# Patient Record
Sex: Female | Born: 1935 | Race: White | Hispanic: No | Marital: Single | Smoking: Former smoker
Health system: Southern US, Community
[De-identification: ages and names within clinical notes are randomized; demographics above are authoritative.]

## PROBLEM LIST (undated history)

## (undated) ENCOUNTER — Emergency Department (HOSPITAL_COMMUNITY): Payer: Medicare HMO | Source: Home / Self Care

## (undated) DIAGNOSIS — I10 Essential (primary) hypertension: Secondary | ICD-10-CM

## (undated) DIAGNOSIS — C719 Malignant neoplasm of brain, unspecified: Secondary | ICD-10-CM

## (undated) DIAGNOSIS — C4490 Unspecified malignant neoplasm of skin, unspecified: Secondary | ICD-10-CM

## (undated) DIAGNOSIS — I4891 Unspecified atrial fibrillation: Secondary | ICD-10-CM

## (undated) DIAGNOSIS — C55 Malignant neoplasm of uterus, part unspecified: Secondary | ICD-10-CM

## (undated) DIAGNOSIS — I219 Acute myocardial infarction, unspecified: Secondary | ICD-10-CM

## (undated) DIAGNOSIS — S065X9A Traumatic subdural hemorrhage with loss of consciousness of unspecified duration, initial encounter: Secondary | ICD-10-CM

## (undated) DIAGNOSIS — C3491 Malignant neoplasm of unspecified part of right bronchus or lung: Principal | ICD-10-CM

## (undated) DIAGNOSIS — I2510 Atherosclerotic heart disease of native coronary artery without angina pectoris: Secondary | ICD-10-CM

## (undated) HISTORY — DX: Malignant neoplasm of unspecified part of right bronchus or lung: C34.91

## (undated) HISTORY — PX: ABDOMINAL HYSTERECTOMY: SHX81

## (undated) HISTORY — PX: REPLACEMENT TOTAL KNEE: SUR1224

## (undated) HISTORY — PX: TONSILLECTOMY: SUR1361

## (undated) HISTORY — PX: CORONARY ANGIOPLASTY WITH STENT PLACEMENT: SHX49

## (undated) HISTORY — PX: APPENDECTOMY: SHX54

---

## 2009-01-01 ENCOUNTER — Ambulatory Visit: Payer: Self-pay | Admitting: Diagnostic Radiology

## 2009-01-01 ENCOUNTER — Emergency Department (HOSPITAL_BASED_OUTPATIENT_CLINIC_OR_DEPARTMENT_OTHER): Admission: EM | Admit: 2009-01-01 | Discharge: 2009-01-01 | Payer: Self-pay | Admitting: Emergency Medicine

## 2009-02-24 ENCOUNTER — Ambulatory Visit: Payer: Self-pay | Admitting: Diagnostic Radiology

## 2009-02-24 ENCOUNTER — Emergency Department (HOSPITAL_BASED_OUTPATIENT_CLINIC_OR_DEPARTMENT_OTHER): Admission: EM | Admit: 2009-02-24 | Discharge: 2009-02-24 | Payer: Self-pay | Admitting: Emergency Medicine

## 2011-01-05 LAB — DIFFERENTIAL
Basophils Absolute: 0.1 10*3/uL (ref 0.0–0.1)
Basophils Relative: 2 % — ABNORMAL HIGH (ref 0–1)
Eosinophils Relative: 2 % (ref 0–5)
Monocytes Absolute: 0.5 10*3/uL (ref 0.1–1.0)

## 2011-01-05 LAB — URINALYSIS, ROUTINE W REFLEX MICROSCOPIC
Bilirubin Urine: NEGATIVE
Glucose, UA: NEGATIVE mg/dL
Ketones, ur: NEGATIVE mg/dL
Specific Gravity, Urine: 1.008 (ref 1.005–1.030)
pH: 6.5 (ref 5.0–8.0)

## 2011-01-05 LAB — BASIC METABOLIC PANEL
CO2: 33 mEq/L — ABNORMAL HIGH (ref 19–32)
Chloride: 100 mEq/L (ref 96–112)
Glucose, Bld: 110 mg/dL — ABNORMAL HIGH (ref 70–99)
Potassium: 4 mEq/L (ref 3.5–5.1)
Sodium: 140 mEq/L (ref 135–145)

## 2011-01-05 LAB — URINE MICROSCOPIC-ADD ON

## 2011-01-05 LAB — CBC
HCT: 45.6 % (ref 36.0–46.0)
Hemoglobin: 14.8 g/dL (ref 12.0–15.0)
MCHC: 32.5 g/dL (ref 30.0–36.0)
RDW: 14 % (ref 11.5–15.5)

## 2011-01-06 LAB — POCT CARDIAC MARKERS
CKMB, poc: 2 ng/mL (ref 1.0–8.0)
Myoglobin, poc: >500 ng/mL (ref 12–200)
Troponin i, poc: <0.05 ng/mL (ref 0.00–0.09)

## 2011-01-06 LAB — COMPREHENSIVE METABOLIC PANEL
ALT: 6 U/L (ref 0–35)
AST: 37 U/L (ref 0–37)
Albumin: 4.2 g/dL (ref 3.5–5.2)
CO2: 32 mEq/L (ref 19–32)
Calcium: 9.2 mg/dL (ref 8.4–10.5)
Creatinine, Ser: 0.8 mg/dL (ref 0.4–1.2)
GFR calc Af Amer: >60 mL/min (ref >60)
Sodium: 136 mEq/L (ref 135–145)
Total Protein: 7.4 g/dL (ref 6.0–8.3)

## 2011-01-06 LAB — CBC
MCHC: 33.7 g/dL (ref 30.0–36.0)
Platelets: 187 10*3/uL (ref 150–400)
RBC: 5.76 MIL/uL — ABNORMAL HIGH (ref 3.87–5.11)
RDW: 14.3 % (ref 11.5–15.5)

## 2011-01-06 LAB — DIFFERENTIAL
Eosinophils Absolute: 0.1 10*3/uL (ref 0.0–0.7)
Eosinophils Relative: 1 % (ref 0–5)
Lymphocytes Relative: 12 % (ref 12–46)
Lymphs Abs: 1.4 10*3/uL (ref 0.7–4.0)
Monocytes Absolute: 0.8 10*3/uL (ref 0.1–1.0)
Monocytes Relative: 7 % (ref 3–12)

## 2015-12-17 ENCOUNTER — Encounter (HOSPITAL_BASED_OUTPATIENT_CLINIC_OR_DEPARTMENT_OTHER): Payer: Self-pay | Admitting: Emergency Medicine

## 2015-12-17 ENCOUNTER — Emergency Department (HOSPITAL_BASED_OUTPATIENT_CLINIC_OR_DEPARTMENT_OTHER)
Admission: EM | Admit: 2015-12-17 | Discharge: 2015-12-17 | Disposition: A | Discharge status: Home / Self Care | Payer: Medicare HMO | Attending: Emergency Medicine | Admitting: Emergency Medicine

## 2015-12-17 DIAGNOSIS — Z8542 Personal history of malignant neoplasm of other parts of uterus: Secondary | ICD-10-CM | POA: Insufficient documentation

## 2015-12-17 DIAGNOSIS — Z880 Allergy status to penicillin: Secondary | ICD-10-CM | POA: Insufficient documentation

## 2015-12-17 DIAGNOSIS — I10 Essential (primary) hypertension: Secondary | ICD-10-CM | POA: Insufficient documentation

## 2015-12-17 DIAGNOSIS — K644 Residual hemorrhoidal skin tags: Secondary | ICD-10-CM | POA: Insufficient documentation

## 2015-12-17 DIAGNOSIS — R61 Generalized hyperhidrosis: Secondary | ICD-10-CM | POA: Insufficient documentation

## 2015-12-17 DIAGNOSIS — I252 Old myocardial infarction: Secondary | ICD-10-CM | POA: Insufficient documentation

## 2015-12-17 DIAGNOSIS — R110 Nausea: Secondary | ICD-10-CM | POA: Diagnosis not present

## 2015-12-17 DIAGNOSIS — Z7901 Long term (current) use of anticoagulants: Secondary | ICD-10-CM | POA: Insufficient documentation

## 2015-12-17 DIAGNOSIS — I4891 Unspecified atrial fibrillation: Secondary | ICD-10-CM | POA: Insufficient documentation

## 2015-12-17 DIAGNOSIS — I2510 Atherosclerotic heart disease of native coronary artery without angina pectoris: Secondary | ICD-10-CM | POA: Diagnosis not present

## 2015-12-17 DIAGNOSIS — R42 Dizziness and giddiness: Secondary | ICD-10-CM | POA: Diagnosis not present

## 2015-12-17 DIAGNOSIS — Z85828 Personal history of other malignant neoplasm of skin: Secondary | ICD-10-CM | POA: Insufficient documentation

## 2015-12-17 DIAGNOSIS — K625 Hemorrhage of anus and rectum: Secondary | ICD-10-CM

## 2015-12-17 HISTORY — DX: Malignant neoplasm of uterus, part unspecified: C55

## 2015-12-17 HISTORY — DX: Acute myocardial infarction, unspecified: I21.9

## 2015-12-17 HISTORY — DX: Essential (primary) hypertension: I10

## 2015-12-17 HISTORY — DX: Unspecified malignant neoplasm of skin, unspecified: C44.90

## 2015-12-17 HISTORY — DX: Unspecified atrial fibrillation: I48.91

## 2015-12-17 HISTORY — DX: Atherosclerotic heart disease of native coronary artery without angina pectoris: I25.10

## 2015-12-17 LAB — URINALYSIS, ROUTINE W REFLEX MICROSCOPIC
BILIRUBIN URINE: NEGATIVE
Glucose, UA: NEGATIVE mg/dL
Ketones, ur: NEGATIVE mg/dL
Leukocytes, UA: NEGATIVE
NITRITE: NEGATIVE
PROTEIN: NEGATIVE mg/dL
SPECIFIC GRAVITY, URINE: 1.011 (ref 1.005–1.030)
pH: 6 (ref 5.0–8.0)

## 2015-12-17 LAB — PROTIME-INR
INR: 2.58 — AB (ref 0.00–1.49)
PROTHROMBIN TIME: 27.3 s — AB (ref 11.6–15.2)

## 2015-12-17 LAB — COMPREHENSIVE METABOLIC PANEL
ALT: 8 U/L — ABNORMAL LOW (ref 14–54)
AST: 21 U/L (ref 15–41)
Albumin: 3.6 g/dL (ref 3.5–5.0)
Alkaline Phosphatase: 96 U/L (ref 38–126)
Anion gap: 8 (ref 5–15)
BILIRUBIN TOTAL: 0.5 mg/dL (ref 0.3–1.2)
BUN: 23 mg/dL — ABNORMAL HIGH (ref 6–20)
CHLORIDE: 101 mmol/L (ref 101–111)
CO2: 30 mmol/L (ref 22–32)
CREATININE: 0.83 mg/dL (ref 0.44–1.00)
Calcium: 9.2 mg/dL (ref 8.9–10.3)
Glucose, Bld: 113 mg/dL — ABNORMAL HIGH (ref 65–99)
POTASSIUM: 3.5 mmol/L (ref 3.5–5.1)
Sodium: 139 mmol/L (ref 135–145)
TOTAL PROTEIN: 6.2 g/dL — AB (ref 6.5–8.1)

## 2015-12-17 LAB — CBC WITH DIFFERENTIAL/PLATELET
BASOS PCT: 0 %
Basophils Absolute: 0 10*3/uL (ref 0.0–0.1)
EOS ABS: 0.1 10*3/uL (ref 0.0–0.7)
EOS PCT: 1 %
HCT: 44.1 % (ref 36.0–46.0)
Hemoglobin: 14.3 g/dL (ref 12.0–15.0)
Lymphocytes Relative: 17 %
Lymphs Abs: 1 10*3/uL (ref 0.7–4.0)
MCH: 27.6 pg (ref 26.0–34.0)
MCHC: 32.4 g/dL (ref 30.0–36.0)
MCV: 85.1 fL (ref 78.0–100.0)
MONO ABS: 0.6 10*3/uL (ref 0.1–1.0)
MONOS PCT: 10 %
NEUTROS PCT: 72 %
Neutro Abs: 4.2 10*3/uL (ref 1.7–7.7)
Platelets: 209 10*3/uL (ref 150–400)
RBC: 5.18 MIL/uL — ABNORMAL HIGH (ref 3.87–5.11)
RDW: 15.2 % (ref 11.5–15.5)
WBC: 5.9 10*3/uL (ref 4.0–10.5)

## 2015-12-17 LAB — URINE MICROSCOPIC-ADD ON

## 2015-12-17 LAB — OCCULT BLOOD X 1 CARD TO LAB, STOOL: FECAL OCCULT BLD: POSITIVE — AB

## 2015-12-17 NOTE — ED Notes (Signed)
Bright red Rectal bleeding since Monday.  Some dizziness on Sunday with nausea which subsided.  Pt states she is on xarelto.

## 2015-12-17 NOTE — Discharge Instructions (Signed)

## 2015-12-17 NOTE — ED Notes (Signed)
MD at bedside. 

## 2015-12-17 NOTE — ED Provider Notes (Signed)
CSN: [id number]     Arrival date & time 12/17/15  0944 History   First MD Initiated Contact with Patient 12/17/15 1013     Chief Complaint  Patient presents with  . Rectal Bleeding     (Consider location/radiation/quality/duration/timing/severity/associated sxs/prior Treatment) Patient is a 82 y.o. female presenting with hematochezia.  Rectal Bleeding  [name] Is a spry 82 year old female who presents the emergency department with chief complaint of rectal bleeding. She has a past medical history of atrial fibrillation for which she takes Xarelto, coronary artery disease, hypertension, skin cancer, uterine cancer, history of MI. The patient states that Sunday, 3 days ago she had an episode while she was out shopping where she became suddenly dizzy, nauseous, diaphoretic with diffuse abdominal pain. She states she is unable to localize the pain. She denies chest pain or shortness of breath. She denies vomiting. Patient states that lasted about 10 minutes, then resolved and had no more problems. Monday night. The patient made a bowel movement and had significant bright red blood within the stool and in the toilet bowl. She continues to have some bleeding after the bowel movement which soaked into a padded shoe for but denies any clotting or heavy bleeding. The patient states she has made 2 more bowel movements with blood. However, the blood seemed to be less than the previous. She denies any known history of internal hemorrhoids. She states she has had previous episodes of rectal bleeding. However, she states it was a single episode that seemed to resolve without significant bleeding such as what she experienced over the past couple days. She denies any lightheadedness, shortness of breath, dyspnea on exertion, presyncope. She denies abdominal pain or tenderness. Past Medical History  Diagnosis Date  . Coronary artery disease   . MI (myocardial infarction) ([place])   . A-fib ([place])   .  Hypertension   . Uterine cancer ([place])   . Skin cancer    Past Surgical History  Procedure Laterality Date  . Abdominal hysterectomy    . Appendectomy    . Tonsillectomy    . Coronary angioplasty with stent placement    . Replacement total knee     No family history on file. Social History  Substance Use Topics  . Smoking status: Never Smoker   . Smokeless tobacco: None  . Alcohol Use: None   OB History    No data available     Review of Systems  Gastrointestinal: Positive for hematochezia.  Ten systems reviewed and are negative for acute change, except as noted in the HPI.    Ten systems reviewed and are negative for acute change, except as noted in the HPI.    Allergies  Iodine; Penicillins; and Sulfa antibiotics  Home Medications   Prior to Admission medications   Medication Sig Start Date End Date Taking? Authorizing Provider  Rivaroxaban (XARELTO) 15 MG TABS tablet Take 15 mg by mouth 2 (two) times daily with a meal.   Yes Historical Provider, MD   BP 140/58 mmHg  Pulse 61  Temp(Src) 98.5 F (36.9 C) (Oral)  Resp 18  Ht '5\' 6"'$  (1.676 m)  Wt 90.266 kg  BMI 32.13 kg/m2  SpO2 97% Physical Exam  Constitutional: She is oriented to person, place, and time. She appears well-developed and well-nourished. No distress.  HENT:  Head: Normocephalic and atraumatic.  Eyes: Conjunctivae are normal. No scleral icterus.  Neck: Normal range of motion.  Cardiovascular: Normal rate, regular rhythm and normal heart sounds.  Exam reveals no gallop and no friction rub.   No murmur heard. Pulmonary/Chest: Effort normal and breath sounds normal. No respiratory distress.  Abdominal: Soft. Bowel sounds are normal. She exhibits no distension and no mass. There is no tenderness. There is no guarding.  Genitourinary:  Digital Rectal Exam reveals sphincter with good tone. Non-thrombosed external hemorrhoids. No masses or fissures. Stool color is brown and red.  Neurological: She is  alert and oriented to person, place, and time.  Skin: Skin is warm and dry. She is not diaphoretic.  Nursing note and vitals reviewed.   ED Course  Procedures (including critical care time) Labs Review Labs Reviewed  CBC WITH DIFFERENTIAL/PLATELET  PROTIME-INR  COMPREHENSIVE METABOLIC PANEL  URINALYSIS, ROUTINE W REFLEX MICROSCOPIC (NOT AT [facility])  OCCULT BLOOD X 1 CARD TO LAB, STOOL    Imaging Review No results found. I have personally reviewed and evaluated these images and lab results as part of my medical decision-making.   EKG Interpretation None      MDM   Final diagnoses:  None    10:34 AM BP 140/58 mmHg  Pulse 61  Temp(Src) 98.5 F (36.9 C) (Oral)  Resp 18  Ht '5\' 6"'$  (1.676 m)  Wt 90.266 kg  BMI 32.13 kg/m2  SpO2 97% Patient with BRPR. +FOBT. Awaiting labs   12:15 PM BP 173/89 mmHg  Pulse 74  Temp(Src) 98.5 F (36.9 C) (Oral)  Resp 18  Ht '5\' 6"'$  (1.676 m)  Wt 90.266 kg  BMI 32.13 kg/m2  SpO2 97%  The patient's CMP normal. UA is negative. Her hemoglobin is 14. PT/INR are elevated. She is asymptomatic at this time. Patient seen in shared visit with attending physician. Discussed exam findings with the patient. Patient is aware she will need a follow-up plan with her PCP and a colonoscopy with a gastroenterologist. I have given her a referral. She appears safe for discharge at this time. Discussed return precautions.  Margarita Mail, PA-C 12/17/15 [place], MD 12/17/15 [phone]

## 2016-09-13 ENCOUNTER — Emergency Department (HOSPITAL_COMMUNITY): Payer: Medicare HMO

## 2016-09-13 ENCOUNTER — Inpatient Hospital Stay (HOSPITAL_COMMUNITY)
Admission: EM | Admit: 2016-09-13 | Discharge: 2016-09-18 | DRG: 082 | Disposition: A | Discharge status: Skilled Nursing Facility | Payer: Medicare HMO | Attending: Internal Medicine | Admitting: Internal Medicine

## 2016-09-13 ENCOUNTER — Encounter (HOSPITAL_COMMUNITY): Payer: Self-pay

## 2016-09-13 DIAGNOSIS — I4892 Unspecified atrial flutter: Secondary | ICD-10-CM | POA: Diagnosis present

## 2016-09-13 DIAGNOSIS — R900 Intracranial space-occupying lesion found on diagnostic imaging of central nervous system: Secondary | ICD-10-CM | POA: Diagnosis present

## 2016-09-13 DIAGNOSIS — W19XXXA Unspecified fall, initial encounter: Secondary | ICD-10-CM

## 2016-09-13 DIAGNOSIS — R918 Other nonspecific abnormal finding of lung field: Secondary | ICD-10-CM

## 2016-09-13 DIAGNOSIS — S12100A Unspecified displaced fracture of second cervical vertebra, initial encounter for closed fracture: Secondary | ICD-10-CM

## 2016-09-13 DIAGNOSIS — G8929 Other chronic pain: Secondary | ICD-10-CM | POA: Diagnosis present

## 2016-09-13 DIAGNOSIS — Z79899 Other long term (current) drug therapy: Secondary | ICD-10-CM

## 2016-09-13 DIAGNOSIS — D496 Neoplasm of unspecified behavior of brain: Secondary | ICD-10-CM

## 2016-09-13 DIAGNOSIS — Z66 Do not resuscitate: Secondary | ICD-10-CM | POA: Diagnosis present

## 2016-09-13 DIAGNOSIS — Z7901 Long term (current) use of anticoagulants: Secondary | ICD-10-CM

## 2016-09-13 DIAGNOSIS — G9389 Other specified disorders of brain: Secondary | ICD-10-CM

## 2016-09-13 DIAGNOSIS — E785 Hyperlipidemia, unspecified: Secondary | ICD-10-CM | POA: Diagnosis present

## 2016-09-13 DIAGNOSIS — Z85828 Personal history of other malignant neoplasm of skin: Secondary | ICD-10-CM

## 2016-09-13 DIAGNOSIS — E860 Dehydration: Secondary | ICD-10-CM | POA: Diagnosis present

## 2016-09-13 DIAGNOSIS — C7931 Secondary malignant neoplasm of brain: Secondary | ICD-10-CM | POA: Diagnosis present

## 2016-09-13 DIAGNOSIS — S065X9A Traumatic subdural hemorrhage with loss of consciousness of unspecified duration, initial encounter: Secondary | ICD-10-CM | POA: Diagnosis not present

## 2016-09-13 DIAGNOSIS — S065XAA Traumatic subdural hemorrhage with loss of consciousness status unknown, initial encounter: Secondary | ICD-10-CM

## 2016-09-13 DIAGNOSIS — I6200 Nontraumatic subdural hemorrhage, unspecified: Secondary | ICD-10-CM | POA: Diagnosis not present

## 2016-09-13 DIAGNOSIS — S0181XA Laceration without foreign body of other part of head, initial encounter: Secondary | ICD-10-CM

## 2016-09-13 DIAGNOSIS — Z8542 Personal history of malignant neoplasm of other parts of uterus: Secondary | ICD-10-CM

## 2016-09-13 DIAGNOSIS — Z955 Presence of coronary angioplasty implant and graft: Secondary | ICD-10-CM

## 2016-09-13 DIAGNOSIS — C801 Malignant (primary) neoplasm, unspecified: Secondary | ICD-10-CM

## 2016-09-13 DIAGNOSIS — J95811 Postprocedural pneumothorax: Secondary | ICD-10-CM

## 2016-09-13 DIAGNOSIS — E871 Hypo-osmolality and hyponatremia: Secondary | ICD-10-CM | POA: Diagnosis present

## 2016-09-13 DIAGNOSIS — Z23 Encounter for immunization: Secondary | ICD-10-CM

## 2016-09-13 DIAGNOSIS — Z923 Personal history of irradiation: Secondary | ICD-10-CM

## 2016-09-13 DIAGNOSIS — S12110A Anterior displaced Type II dens fracture, initial encounter for closed fracture: Secondary | ICD-10-CM | POA: Diagnosis present

## 2016-09-13 DIAGNOSIS — C3431 Malignant neoplasm of lower lobe, right bronchus or lung: Secondary | ICD-10-CM | POA: Diagnosis present

## 2016-09-13 DIAGNOSIS — I4891 Unspecified atrial fibrillation: Secondary | ICD-10-CM | POA: Diagnosis present

## 2016-09-13 DIAGNOSIS — I219 Acute myocardial infarction, unspecified: Secondary | ICD-10-CM | POA: Diagnosis present

## 2016-09-13 DIAGNOSIS — S12001A Unspecified nondisplaced fracture of first cervical vertebra, initial encounter for closed fracture: Secondary | ICD-10-CM

## 2016-09-13 DIAGNOSIS — I10 Essential (primary) hypertension: Secondary | ICD-10-CM | POA: Diagnosis present

## 2016-09-13 DIAGNOSIS — C55 Malignant neoplasm of uterus, part unspecified: Secondary | ICD-10-CM | POA: Diagnosis present

## 2016-09-13 DIAGNOSIS — I252 Old myocardial infarction: Secondary | ICD-10-CM

## 2016-09-13 DIAGNOSIS — K219 Gastro-esophageal reflux disease without esophagitis: Secondary | ICD-10-CM | POA: Diagnosis present

## 2016-09-13 DIAGNOSIS — I2510 Atherosclerotic heart disease of native coronary artery without angina pectoris: Secondary | ICD-10-CM | POA: Diagnosis present

## 2016-09-13 DIAGNOSIS — S02113A Unspecified occipital condyle fracture, initial encounter for closed fracture: Secondary | ICD-10-CM | POA: Diagnosis present

## 2016-09-13 DIAGNOSIS — Z7982 Long term (current) use of aspirin: Secondary | ICD-10-CM

## 2016-09-13 DIAGNOSIS — G936 Cerebral edema: Secondary | ICD-10-CM | POA: Diagnosis present

## 2016-09-13 DIAGNOSIS — M6281 Muscle weakness (generalized): Secondary | ICD-10-CM

## 2016-09-13 HISTORY — DX: Traumatic subdural hemorrhage with loss of consciousness of unspecified duration, initial encounter: S06.5X9A

## 2016-09-13 HISTORY — DX: Traumatic subdural hemorrhage with loss of consciousness status unknown, initial encounter: S06.5XAA

## 2016-09-13 LAB — CBC WITH DIFFERENTIAL/PLATELET
BASOS ABS: 0 10*3/uL (ref 0.0–0.1)
BASOS PCT: 0 %
EOS ABS: 0 10*3/uL (ref 0.0–0.7)
Eosinophils Relative: 0 %
HEMATOCRIT: 42.5 % (ref 36.0–46.0)
Hemoglobin: 13.9 g/dL (ref 12.0–15.0)
Lymphocytes Relative: 8 %
Lymphs Abs: 1.1 10*3/uL (ref 0.7–4.0)
MCH: 27.1 pg (ref 26.0–34.0)
MCHC: 32.7 g/dL (ref 30.0–36.0)
MCV: 83 fL (ref 78.0–100.0)
MONO ABS: 0.8 10*3/uL (ref 0.1–1.0)
MONOS PCT: 6 %
NEUTROS ABS: 11.9 10*3/uL — AB (ref 1.7–7.7)
NEUTROS PCT: 86 %
PLATELETS: 218 10*3/uL (ref 150–400)
RBC: 5.12 MIL/uL — ABNORMAL HIGH (ref 3.87–5.11)
RDW: 14.9 % (ref 11.5–15.5)
WBC: 13.8 10*3/uL — ABNORMAL HIGH (ref 4.0–10.5)

## 2016-09-13 LAB — URINALYSIS, ROUTINE W REFLEX MICROSCOPIC
BILIRUBIN URINE: NEGATIVE
GLUCOSE, UA: NEGATIVE mg/dL
KETONES UR: NEGATIVE mg/dL
LEUKOCYTES UA: NEGATIVE
NITRITE: NEGATIVE
PH: 8 (ref 5.0–8.0)
Protein, ur: NEGATIVE mg/dL
SPECIFIC GRAVITY, URINE: 1.011 (ref 1.005–1.030)

## 2016-09-13 LAB — COMPREHENSIVE METABOLIC PANEL
ALK PHOS: 97 U/L (ref 38–126)
ALT: 11 U/L — AB (ref 14–54)
ANION GAP: 8 (ref 5–15)
AST: 28 U/L (ref 15–41)
Albumin: 3.4 g/dL — ABNORMAL LOW (ref 3.5–5.0)
BILIRUBIN TOTAL: 0.6 mg/dL (ref 0.3–1.2)
BUN: 24 mg/dL — ABNORMAL HIGH (ref 6–20)
CALCIUM: 8.9 mg/dL (ref 8.9–10.3)
CO2: 31 mmol/L (ref 22–32)
CREATININE: 0.81 mg/dL (ref 0.44–1.00)
Chloride: 97 mmol/L — ABNORMAL LOW (ref 101–111)
GFR calc non Af Amer: >60 mL/min (ref >60)
GLUCOSE: 180 mg/dL — AB (ref 65–99)
Potassium: 3.6 mmol/L (ref 3.5–5.1)
Sodium: 136 mmol/L (ref 135–145)
TOTAL PROTEIN: 6 g/dL — AB (ref 6.5–8.1)

## 2016-09-13 LAB — TROPONIN I: Troponin I: <0.03 ng/mL (ref <0.03)

## 2016-09-13 MED ORDER — LIDOCAINE-EPINEPHRINE (PF) 2 %-1:200000 IJ SOLN
10.0000 mL | Freq: Once | INTRAMUSCULAR | Status: AC
  Administered 2016-09-14: 10 mL
  Filled 2016-09-13: qty 20

## 2016-09-13 MED ORDER — TETANUS-DIPHTH-ACELL PERTUSSIS 5-2.5-18.5 LF-MCG/0.5 IM SUSP
0.5000 mL | Freq: Once | INTRAMUSCULAR | Status: AC
  Administered 2016-09-13: 0.5 mL via INTRAMUSCULAR
  Filled 2016-09-13: qty 0.5

## 2016-09-13 NOTE — ED Provider Notes (Signed)
[place] DEPT Provider Note   CSN: [id number] Arrival date & time: 09/13/16  1941     History   Chief Complaint Chief Complaint  Patient presents with  . Fall  . Head Injury    HPI [name] is a 82 y.o. female.  HPI Patient presents after a fall. She states she was walking and her right arm was little tingly. States she's had this problem for a while and said it was a tremor. States she then became more dizzy and fell down. She struck her head. She states she was unable to get up. She normally would be able to get up with something like this. No loss consciousness. She has swelling on her face and bleeding. She is on Xarelto for atrial fibrillation. Bleeding around her eye. Some neck pain. Also pain in right lower leg. No chest or abdominal pain. States she still feels dizziness and does not think should be able to get up.   Past Medical History:  Diagnosis Date  . A-fib ([place])   . Coronary artery disease   . Hypertension   . MI (myocardial infarction)   . Skin cancer   . Uterine cancer [facility])     Patient Active Problem List   Diagnosis Date Noted  . Subdural hematoma ([place]) 09/14/2016    Past Surgical History:  Procedure Laterality Date  . ABDOMINAL HYSTERECTOMY    . APPENDECTOMY    . CORONARY ANGIOPLASTY WITH STENT PLACEMENT    . REPLACEMENT TOTAL KNEE    . TONSILLECTOMY      OB History    No data available       Home Medications    Prior to Admission medications   Medication Sig Start Date End Date Taking? Authorizing Provider  acetaminophen (TYLENOL) 325 MG tablet Take 325-650 mg by mouth every 6 (six) hours as needed (pain).    Yes Historical Provider, MD  Ascorbic Acid (VITAMIN C) 1000 MG tablet Take 1,000 mg by mouth daily.   Yes Historical Provider, MD  aspirin EC 81 MG tablet Take 81 mg by mouth daily.   Yes Historical Provider, MD  atorvastatin (LIPITOR) 40 MG tablet Take 40 mg by mouth at bedtime.    Yes Historical Provider, MD  beta  carotene w/minerals (OCUVITE) tablet Take 2 tablets by mouth daily.   Yes Historical Provider, MD  Calcium Carb-Cholecalciferol (CALCIUM 1000 + D PO) Take 1,000 mg by mouth daily.   Yes Historical Provider, MD  diltiazem (TIAZAC) 240 MG 24 hr capsule Take 240 mg by mouth daily.  07/07/16  Yes Historical Provider, MD  ezetimibe (ZETIA) 10 MG tablet Take 10 mg by mouth at bedtime.    Yes Historical Provider, MD  hydrochlorothiazide (HYDRODIURIL) 25 MG tablet Take 12.5 mg by mouth daily.    Yes Historical Provider, MD  metoprolol succinate (TOPROL-XL) 50 MG 24 hr tablet Take 50 mg by mouth daily.  07/29/16  Yes Historical Provider, MD  mirabegron ER (MYRBETRIQ) 50 MG TB24 tablet Take 50 mg by mouth daily.   Yes Historical Provider, MD  omeprazole (PRILOSEC) 40 MG capsule Take 40 mg by mouth daily.   Yes Historical Provider, MD  Propylene Glycol-Glycerin (SOOTHE OP) Place 1 drop into both eyes daily.   Yes Historical Provider, MD  rivaroxaban (XARELTO) 20 MG TABS tablet Take 20 mg by mouth daily with breakfast.    Yes Historical Provider, MD  Zinc 50 MG TABS Take 50 mg by mouth daily.   Yes Historical Provider,  MD    Family History History reviewed. No pertinent family history.  Social History Social History  Substance Use Topics  . Smoking status: Never Smoker  . Smokeless tobacco: Not on file  . Alcohol use Not on file     Allergies   Codeine; Dabigatran; Iodine; Penicillins; and Sulfa antibiotics   Review of Systems Review of Systems  Constitutional: Negative for appetite change and fever.  HENT: Negative for congestion.   Eyes: Negative for visual disturbance.  Respiratory: Negative for shortness of breath.   Gastrointestinal: Negative for nausea.  Genitourinary: Negative for dysuria and flank pain.  Musculoskeletal: Positive for neck pain. Negative for back pain.  Skin: Negative for wound.  Neurological: Positive for tremors.  Hematological: Bruises/bleeds easily.    Psychiatric/Behavioral: Negative for confusion.     Physical Exam Updated Vital Signs BP 148/88   Pulse 64   Resp 16   SpO2 97%   Physical Exam  Constitutional: She appears well-developed.  HENT:  Head: Normocephalic.  2 cm laceration on upper forehead above hematoma. There is some conjunctival hemorrhage on the left eye. No hyphema. Eye movements intact. There is a laceration on the lower lid right lower lid margin the proceeds laterally and then down with a flap. It is not full-thickness.  Eyes: EOM are normal.  Neck:  Tenderness on the mid to upper cervical spine.  Cardiovascular: Normal rate.   Pulmonary/Chest: Effort normal.  Abdominal: Soft.  Musculoskeletal: She exhibits tenderness.  Some tenderness and mild ecchymosis to right anterior lower leg. Neurovascular intact in both feet. No tenderness. No tenderness over shoulders or hands.  Skin: Skin is warm. Capillary refill takes less than 2 seconds.     ED Treatments / Results  Labs (all labs ordered are listed, but only abnormal results are displayed) Labs Reviewed  COMPREHENSIVE METABOLIC PANEL - Abnormal; Notable for the following:       Result Value   Chloride 97 (*)    Glucose, Bld 180 (*)    BUN 24 (*)    Total Protein 6.0 (*)    Albumin 3.4 (*)    ALT 11 (*)    All other components within normal limits  CBC WITH DIFFERENTIAL/PLATELET - Abnormal; Notable for the following:    WBC 13.8 (*)    RBC 5.12 (*)    Neutro Abs 11.9 (*)    All other components within normal limits  URINALYSIS, ROUTINE W REFLEX MICROSCOPIC - Abnormal; Notable for the following:    Color, Urine STRAW (*)    Hgb urine dipstick LARGE (*)    Bacteria, UA RARE (*)    Squamous Epithelial / LPF 0-5 (*)    All other components within normal limits  TROPONIN I  BRAIN NATRIURETIC PEPTIDE    EKG  EKG Interpretation  Date/Time:  Monday September 13 2016 20:27:10 EST Ventricular Rate:  56 PR Interval:    QRS Duration: 127 QT  Interval:  511 QTC Calculation: 494 R Axis:   51 Text Interpretation:  Atrial flutter Nonspecific intraventricular conduction delay ST elevation, consider inferior injury Confirmed by Alvino Chapel  MD, Kannon Baum [phone]) on 09/13/2016 8:43:58 PM       Radiology Dg Chest 2 View  Result Date: 09/13/2016 CLINICAL DATA:  Initial evaluation for acute dizziness, fall. EXAM: CHEST  2 VIEW COMPARISON:  Prior radiograph from 01/01/2009. FINDINGS: Moderate cardiomegaly, slightly progressed relative to most recent radiograph from 2010. Mediastinal silhouette within normal limits. Aortic atherosclerosis, also slightly progressed. Lungs normally inflated. Mild  diffuse interstitial prominence, suggesting mild interstitial edema. No focal infiltrates. No significant pleural effusion. No pneumothorax. No acute osseous abnormality. IMPRESSION: 1. Cardiomegaly with mild diffuse interstitial congestion/edema. Cardiomegaly has mildly progressed relative to most recent radiograph from 01/01/2009. 2. No other active cardiopulmonary disease. 3. Aortic atherosclerosis, also progressed relative to 2010. Electronically Signed   By: Jeannine Boga M.D.   On: 09/13/2016 21:40   Dg Tibia/fibula Right  Result Date: 09/13/2016 CLINICAL DATA:  Initial evaluation for acute trauma, fall. EXAM: RIGHT TIBIA AND FIBULA - 2 VIEW COMPARISON:  None. FINDINGS: There is no evidence of fracture or other focal bone lesions. So no acute soft tissue abnormality about the leg scattered atheromatous vascular calcifications noted posterior to the knee. Advanced degenerative changes noted about the partially visualized knee. IMPRESSION: 1. No acute osseous abnormality about the right tibia/fibula. 2. Advanced degenerative osteoarthrosis about the right knee. 3. Atherosclerotic disease. Electronically Signed   By: Jeannine Boga M.D.   On: 09/13/2016 21:42   Ct Head Wo Contrast  Result Date: 09/13/2016 CLINICAL DATA:  82 year old female  with dizziness and fall. History of breast cancer. EXAM: CT HEAD WITHOUT CONTRAST CT MAXILLOFACIAL WITHOUT CONTRAST CT CERVICAL SPINE WITHOUT CONTRAST TECHNIQUE: Multidetector CT imaging of the head, cervical spine, and maxillofacial structures were performed using the standard protocol without intravenous contrast. Multiplanar CT image reconstructions of the cervical spine and maxillofacial structures were also generated. COMPARISON:  Head CT dated 02/24/2009 FINDINGS: CT HEAD FINDINGS Brain: There is a left frontotemporal subdural hemorrhage measuring up to 4 mm in thickness. Right anterior parafalcine subdural hemorrhage measures 4 mm in thickness. An area of white matter edema noted over the left frontal convexity compatible with vasogenic edema. There is mild mass effect and effacement of the adjacent sulci. A 1.2 x 0.9 cm density in the left frontal convexity (series 2, image 23 and sagittal series 5 image 41) is concerning for a primary neoplasm versus metastatic disease. Further evaluation with MRI without and with contrast is recommended. There is mild age-related atrophy and chronic microvascular ischemic changes. No midline shift noted. The quadrigeminal plate cistern is patent. Vascular: No hyperdense vessel or unexpected calcification. Skull: Nondisplaced fracture of the right occipital condyle. No other calcaneal fracture. Other: Large left forehead hematoma. CT MAXILLOFACIAL FINDINGS Osseous: No fracture or mandibular dislocation. No destructive process. Chronic degenerative changes at the left TMJ. Orbits: Bilateral cataract surgeries noted. The globes and retro-orbital fat are preserved. Sinuses: Clear. Soft tissues: Left forehead hematoma. CT CERVICAL SPINE FINDINGS Alignment: No acute subluxation. Skull base and vertebrae: There is nondisplaced comminuted appearing fracture of the right occipital condyle. There is nondisplaced fracture of the right posterior arch of C1. There is incomplete bony  fusion of the posterior ring of C1. There is type III fracture of the base of the odontoid process with approximately 2 mm posterior displacement of the tip of the odontoid. Evaluation for fracture is very limited due to advanced osteopenia. There is apparent ankylosis of the C2-C5 posterior elements related to chronic changes and inflammation. Soft tissues and spinal canal: No prevertebral fluid or swelling. No visible canal hematoma. Disc levels:  Multilevel degenerative changes. Upper chest: Bilateral carotid bulb atherosclerotic disease. Other: None IMPRESSION: Left frontal white matter vasogenic edema with suspected underlying mass/metastatic disease. MRI without and with contrast is recommended for further evaluation. Small left frontotemporal subdural hemorrhage as well as anterior parafalcine subdural hemorrhage. No midline shift. Nondisplaced comminuted appearing fracture of the right occipital condyle. Nondisplaced fracture of  the right posterior arch of C1. Type III fracture of the odontoid process with 2 mm posterior displacement. These results were called by telephone at the time of interpretation on 09/13/2016 at 9:42 pm to Dr. Davonna Belling , who verbally acknowledged these results. Electronically Signed   By: Anner Crete M.D.   On: 09/13/2016 21:48   Ct Cervical Spine Wo Contrast  Result Date: 09/13/2016 CLINICAL DATA:  82 year old female with dizziness and fall. History of breast cancer. EXAM: CT HEAD WITHOUT CONTRAST CT MAXILLOFACIAL WITHOUT CONTRAST CT CERVICAL SPINE WITHOUT CONTRAST TECHNIQUE: Multidetector CT imaging of the head, cervical spine, and maxillofacial structures were performed using the standard protocol without intravenous contrast. Multiplanar CT image reconstructions of the cervical spine and maxillofacial structures were also generated. COMPARISON:  Head CT dated 02/24/2009 FINDINGS: CT HEAD FINDINGS Brain: There is a left frontotemporal subdural hemorrhage measuring  up to 4 mm in thickness. Right anterior parafalcine subdural hemorrhage measures 4 mm in thickness. An area of white matter edema noted over the left frontal convexity compatible with vasogenic edema. There is mild mass effect and effacement of the adjacent sulci. A 1.2 x 0.9 cm density in the left frontal convexity (series 2, image 23 and sagittal series 5 image 41) is concerning for a primary neoplasm versus metastatic disease. Further evaluation with MRI without and with contrast is recommended. There is mild age-related atrophy and chronic microvascular ischemic changes. No midline shift noted. The quadrigeminal plate cistern is patent. Vascular: No hyperdense vessel or unexpected calcification. Skull: Nondisplaced fracture of the right occipital condyle. No other calcaneal fracture. Other: Large left forehead hematoma. CT MAXILLOFACIAL FINDINGS Osseous: No fracture or mandibular dislocation. No destructive process. Chronic degenerative changes at the left TMJ. Orbits: Bilateral cataract surgeries noted. The globes and retro-orbital fat are preserved. Sinuses: Clear. Soft tissues: Left forehead hematoma. CT CERVICAL SPINE FINDINGS Alignment: No acute subluxation. Skull base and vertebrae: There is nondisplaced comminuted appearing fracture of the right occipital condyle. There is nondisplaced fracture of the right posterior arch of C1. There is incomplete bony fusion of the posterior ring of C1. There is type III fracture of the base of the odontoid process with approximately 2 mm posterior displacement of the tip of the odontoid. Evaluation for fracture is very limited due to advanced osteopenia. There is apparent ankylosis of the C2-C5 posterior elements related to chronic changes and inflammation. Soft tissues and spinal canal: No prevertebral fluid or swelling. No visible canal hematoma. Disc levels:  Multilevel degenerative changes. Upper chest: Bilateral carotid bulb atherosclerotic disease. Other: None  IMPRESSION: Left frontal white matter vasogenic edema with suspected underlying mass/metastatic disease. MRI without and with contrast is recommended for further evaluation. Small left frontotemporal subdural hemorrhage as well as anterior parafalcine subdural hemorrhage. No midline shift. Nondisplaced comminuted appearing fracture of the right occipital condyle. Nondisplaced fracture of the right posterior arch of C1. Type III fracture of the odontoid process with 2 mm posterior displacement. These results were called by telephone at the time of interpretation on 09/13/2016 at 9:42 pm to Dr. Davonna Belling , who verbally acknowledged these results. Electronically Signed   By: Anner Crete M.D.   On: 09/13/2016 21:48   Ct Maxillofacial Wo Contrast  Result Date: 09/13/2016 CLINICAL DATA:  82 year old female with dizziness and fall. History of breast cancer. EXAM: CT HEAD WITHOUT CONTRAST CT MAXILLOFACIAL WITHOUT CONTRAST CT CERVICAL SPINE WITHOUT CONTRAST TECHNIQUE: Multidetector CT imaging of the head, cervical spine, and maxillofacial structures were performed using the  standard protocol without intravenous contrast. Multiplanar CT image reconstructions of the cervical spine and maxillofacial structures were also generated. COMPARISON:  Head CT dated 02/24/2009 FINDINGS: CT HEAD FINDINGS Brain: There is a left frontotemporal subdural hemorrhage measuring up to 4 mm in thickness. Right anterior parafalcine subdural hemorrhage measures 4 mm in thickness. An area of white matter edema noted over the left frontal convexity compatible with vasogenic edema. There is mild mass effect and effacement of the adjacent sulci. A 1.2 x 0.9 cm density in the left frontal convexity (series 2, image 23 and sagittal series 5 image 41) is concerning for a primary neoplasm versus metastatic disease. Further evaluation with MRI without and with contrast is recommended. There is mild age-related atrophy and chronic  microvascular ischemic changes. No midline shift noted. The quadrigeminal plate cistern is patent. Vascular: No hyperdense vessel or unexpected calcification. Skull: Nondisplaced fracture of the right occipital condyle. No other calcaneal fracture. Other: Large left forehead hematoma. CT MAXILLOFACIAL FINDINGS Osseous: No fracture or mandibular dislocation. No destructive process. Chronic degenerative changes at the left TMJ. Orbits: Bilateral cataract surgeries noted. The globes and retro-orbital fat are preserved. Sinuses: Clear. Soft tissues: Left forehead hematoma. CT CERVICAL SPINE FINDINGS Alignment: No acute subluxation. Skull base and vertebrae: There is nondisplaced comminuted appearing fracture of the right occipital condyle. There is nondisplaced fracture of the right posterior arch of C1. There is incomplete bony fusion of the posterior ring of C1. There is type III fracture of the base of the odontoid process with approximately 2 mm posterior displacement of the tip of the odontoid. Evaluation for fracture is very limited due to advanced osteopenia. There is apparent ankylosis of the C2-C5 posterior elements related to chronic changes and inflammation. Soft tissues and spinal canal: No prevertebral fluid or swelling. No visible canal hematoma. Disc levels:  Multilevel degenerative changes. Upper chest: Bilateral carotid bulb atherosclerotic disease. Other: None IMPRESSION: Left frontal white matter vasogenic edema with suspected underlying mass/metastatic disease. MRI without and with contrast is recommended for further evaluation. Small left frontotemporal subdural hemorrhage as well as anterior parafalcine subdural hemorrhage. No midline shift. Nondisplaced comminuted appearing fracture of the right occipital condyle. Nondisplaced fracture of the right posterior arch of C1. Type III fracture of the odontoid process with 2 mm posterior displacement. These results were called by telephone at the time of  interpretation on 09/13/2016 at 9:42 pm to Dr. Davonna Belling , who verbally acknowledged these results. Electronically Signed   By: Anner Crete M.D.   On: 09/13/2016 21:48    Procedures Procedures (including critical care time)  Medications Ordered in ED Medications  fentaNYL (SUBLIMAZE) injection 25-50 mcg (not administered)  Tdap (BOOSTRIX) injection 0.5 mL (0.5 mLs Intramuscular Given 09/13/16 2100)  lidocaine-EPINEPHrine (XYLOCAINE W/EPI) 2 %-1:200000 (PF) injection 10 mL (10 mLs Other Given 09/14/16 0024)     Initial Impression / Assessment and Plan / ED Course  I have reviewed the triage vital signs and the nursing notes.  Pertinent labs & imaging results that were available during my care of the patient were reviewed by me and considered in my medical decision making (see chart for details).  Clinical Course    Patient with fall. Facial lacerations have been closed. She does have significant hematomas due to her anticoagulation. Does have small subdural hematoma. Discussed with Dr. Cyndy Freeze who reviewed the report. States that 4 mm hematoma does not even need reversal of her anticoagulation. She will need reversal if she worsens clinically. Will admit.  Will need ophthalmology follow-up for the eyelid laceration. Also found to have likely brain mass. Previous history of ovarian cancer. Will admit to internal medicine.    Final Clinical Impressions(s) / ED Diagnoses   Final diagnoses:  Facial laceration, initial encounter  Fall, initial encounter  Subdural hematoma ([place])  Brain mass  Closed nondisplaced fracture of first cervical vertebra, unspecified fracture morphology, initial encounter ([place])  Closed odontoid fracture, initial encounter [facility])    New Prescriptions New Prescriptions   No medications on file     Davonna Belling, MD 09/14/16 [phone]

## 2016-09-13 NOTE — ED Provider Notes (Signed)
LACERATION REPAIR Performed by: Linus Mako Authorized by: Linus Mako Consent: Verbal consent obtained. Risks and benefits: risks, benefits and alternatives were discussed Consent given by: patient Patient identity confirmed: provided demographic data Prepped and Draped in normal sterile fashion Wound explored  Laceration Location: complex left eye  Laceration Length: 3 cm  No Foreign Bodies seen or palpated  Anesthesia: local infiltration  Local anesthetic: lidocaine 2% w epinephrine  Anesthetic total: 1 ml  Irrigation method: syringe Amount of cleaning: standard  Skin closure: sutures  Number of sutures: 2  Technique: simple interrupted  Patient tolerance: Patient tolerated the procedure well with no immediate complications.   Patient seen by Dr. Alvino Chapel, refer to his note for HPI, ROS, PE, and Dispo     Delos Haring, PA-C 09/13/16 [place], MD 09/14/16 [phone]

## 2016-09-13 NOTE — ED Triage Notes (Signed)
Per EMS: Pt states some dizziness, fell face first onto floor. Lac to L side of face and forehead. Pt on Xeralto. Pt denies any LOC. EMS gave 61mg fentanyl. Pt with hx of aflutter, pt aflutter on monitor at this time.

## 2016-09-14 ENCOUNTER — Encounter (HOSPITAL_COMMUNITY): Payer: Self-pay | Admitting: *Deleted

## 2016-09-14 ENCOUNTER — Inpatient Hospital Stay (HOSPITAL_COMMUNITY): Payer: Medicare HMO

## 2016-09-14 DIAGNOSIS — G936 Cerebral edema: Secondary | ICD-10-CM | POA: Diagnosis not present

## 2016-09-14 DIAGNOSIS — I4892 Unspecified atrial flutter: Secondary | ICD-10-CM | POA: Diagnosis not present

## 2016-09-14 DIAGNOSIS — G8929 Other chronic pain: Secondary | ICD-10-CM | POA: Diagnosis not present

## 2016-09-14 DIAGNOSIS — Z23 Encounter for immunization: Secondary | ICD-10-CM | POA: Diagnosis present

## 2016-09-14 DIAGNOSIS — Z66 Do not resuscitate: Secondary | ICD-10-CM | POA: Diagnosis not present

## 2016-09-14 DIAGNOSIS — I219 Acute myocardial infarction, unspecified: Secondary | ICD-10-CM | POA: Diagnosis present

## 2016-09-14 DIAGNOSIS — I2510 Atherosclerotic heart disease of native coronary artery without angina pectoris: Secondary | ICD-10-CM | POA: Diagnosis present

## 2016-09-14 DIAGNOSIS — C7931 Secondary malignant neoplasm of brain: Secondary | ICD-10-CM | POA: Diagnosis not present

## 2016-09-14 DIAGNOSIS — E785 Hyperlipidemia, unspecified: Secondary | ICD-10-CM | POA: Diagnosis not present

## 2016-09-14 DIAGNOSIS — E871 Hypo-osmolality and hyponatremia: Secondary | ICD-10-CM | POA: Diagnosis not present

## 2016-09-14 DIAGNOSIS — S065XAA Traumatic subdural hemorrhage with loss of consciousness status unknown, initial encounter: Secondary | ICD-10-CM | POA: Diagnosis present

## 2016-09-14 DIAGNOSIS — K219 Gastro-esophageal reflux disease without esophagitis: Secondary | ICD-10-CM | POA: Diagnosis not present

## 2016-09-14 DIAGNOSIS — Z7901 Long term (current) use of anticoagulants: Secondary | ICD-10-CM | POA: Diagnosis not present

## 2016-09-14 DIAGNOSIS — S065X9A Traumatic subdural hemorrhage with loss of consciousness of unspecified duration, initial encounter: Secondary | ICD-10-CM | POA: Diagnosis present

## 2016-09-14 DIAGNOSIS — I10 Essential (primary) hypertension: Secondary | ICD-10-CM | POA: Diagnosis not present

## 2016-09-14 DIAGNOSIS — I482 Chronic atrial fibrillation: Secondary | ICD-10-CM | POA: Diagnosis not present

## 2016-09-14 DIAGNOSIS — Z7982 Long term (current) use of aspirin: Secondary | ICD-10-CM | POA: Diagnosis not present

## 2016-09-14 DIAGNOSIS — I25119 Atherosclerotic heart disease of native coronary artery with unspecified angina pectoris: Secondary | ICD-10-CM | POA: Diagnosis not present

## 2016-09-14 DIAGNOSIS — I6200 Nontraumatic subdural hemorrhage, unspecified: Secondary | ICD-10-CM | POA: Diagnosis present

## 2016-09-14 DIAGNOSIS — R918 Other nonspecific abnormal finding of lung field: Secondary | ICD-10-CM | POA: Diagnosis not present

## 2016-09-14 DIAGNOSIS — Z923 Personal history of irradiation: Secondary | ICD-10-CM | POA: Diagnosis not present

## 2016-09-14 DIAGNOSIS — I4891 Unspecified atrial fibrillation: Secondary | ICD-10-CM | POA: Diagnosis present

## 2016-09-14 DIAGNOSIS — R900 Intracranial space-occupying lesion found on diagnostic imaging of central nervous system: Secondary | ICD-10-CM | POA: Diagnosis present

## 2016-09-14 DIAGNOSIS — Z955 Presence of coronary angioplasty implant and graft: Secondary | ICD-10-CM | POA: Diagnosis not present

## 2016-09-14 DIAGNOSIS — E860 Dehydration: Secondary | ICD-10-CM | POA: Diagnosis not present

## 2016-09-14 DIAGNOSIS — S12110A Anterior displaced Type II dens fracture, initial encounter for closed fracture: Secondary | ICD-10-CM | POA: Diagnosis not present

## 2016-09-14 DIAGNOSIS — C3431 Malignant neoplasm of lower lobe, right bronchus or lung: Secondary | ICD-10-CM | POA: Diagnosis not present

## 2016-09-14 DIAGNOSIS — Z8542 Personal history of malignant neoplasm of other parts of uterus: Secondary | ICD-10-CM | POA: Diagnosis not present

## 2016-09-14 DIAGNOSIS — Z79899 Other long term (current) drug therapy: Secondary | ICD-10-CM | POA: Diagnosis not present

## 2016-09-14 DIAGNOSIS — C55 Malignant neoplasm of uterus, part unspecified: Secondary | ICD-10-CM | POA: Diagnosis present

## 2016-09-14 DIAGNOSIS — I252 Old myocardial infarction: Secondary | ICD-10-CM | POA: Diagnosis not present

## 2016-09-14 DIAGNOSIS — Z85828 Personal history of other malignant neoplasm of skin: Secondary | ICD-10-CM | POA: Diagnosis not present

## 2016-09-14 DIAGNOSIS — W19XXXA Unspecified fall, initial encounter: Secondary | ICD-10-CM | POA: Diagnosis not present

## 2016-09-14 LAB — BASIC METABOLIC PANEL
Anion gap: 11 (ref 5–15)
BUN: 17 mg/dL (ref 6–20)
CHLORIDE: 95 mmol/L — AB (ref 101–111)
CO2: 28 mmol/L (ref 22–32)
CREATININE: 0.67 mg/dL (ref 0.44–1.00)
Calcium: 8.4 mg/dL — ABNORMAL LOW (ref 8.9–10.3)
GFR calc Af Amer: >60 mL/min (ref >60)
GFR calc non Af Amer: >60 mL/min (ref >60)
Glucose, Bld: 164 mg/dL — ABNORMAL HIGH (ref 65–99)
POTASSIUM: 3.4 mmol/L — AB (ref 3.5–5.1)
SODIUM: 134 mmol/L — AB (ref 135–145)

## 2016-09-14 LAB — CBC
HEMATOCRIT: 39.6 % (ref 36.0–46.0)
HEMOGLOBIN: 12.7 g/dL (ref 12.0–15.0)
MCH: 26.8 pg (ref 26.0–34.0)
MCHC: 32.1 g/dL (ref 30.0–36.0)
MCV: 83.7 fL (ref 78.0–100.0)
Platelets: 187 10*3/uL (ref 150–400)
RBC: 4.73 MIL/uL (ref 3.87–5.11)
RDW: 15.1 % (ref 11.5–15.5)
WBC: 8.8 10*3/uL (ref 4.0–10.5)

## 2016-09-14 LAB — MRSA PCR SCREENING: MRSA BY PCR: NEGATIVE

## 2016-09-14 LAB — BRAIN NATRIURETIC PEPTIDE: B NATRIURETIC PEPTIDE 5: 77 pg/mL (ref 0.0–100.0)

## 2016-09-14 MED ORDER — ATORVASTATIN CALCIUM 40 MG PO TABS
40.0000 mg | ORAL_TABLET | Freq: Every day | ORAL | Status: DC
  Administered 2016-09-14 – 2016-09-17 (×4): 40 mg via ORAL
  Filled 2016-09-14 (×4): qty 1

## 2016-09-14 MED ORDER — OXYCODONE-ACETAMINOPHEN 5-325 MG PO TABS
2.0000 | ORAL_TABLET | ORAL | Status: DC | PRN
  Administered 2016-09-14 – 2016-09-17 (×7): 2 via ORAL
  Administered 2016-09-18: 1 via ORAL
  Filled 2016-09-14 (×8): qty 2

## 2016-09-14 MED ORDER — FENTANYL CITRATE (PF) 100 MCG/2ML IJ SOLN
25.0000 ug | INTRAMUSCULAR | Status: DC | PRN
Start: 2016-09-14 — End: 2016-09-14
  Administered 2016-09-14: 50 ug via INTRAVENOUS
  Administered 2016-09-14 (×2): 25 ug via INTRAVENOUS
  Filled 2016-09-14 (×3): qty 2

## 2016-09-14 MED ORDER — IPRATROPIUM BROMIDE 0.02 % IN SOLN: 0.5000 mg | RESPIRATORY_TRACT | Status: DC | PRN

## 2016-09-14 MED ORDER — EZETIMIBE 10 MG PO TABS
10.0000 mg | ORAL_TABLET | Freq: Every day | ORAL | Status: DC
  Administered 2016-09-14 – 2016-09-17 (×4): 10 mg via ORAL
  Filled 2016-09-14 (×4): qty 1

## 2016-09-14 MED ORDER — GADOBENATE DIMEGLUMINE 529 MG/ML IV SOLN
20.0000 mL | Freq: Once | INTRAVENOUS | Status: AC | PRN
  Administered 2016-09-14: 18 mL via INTRAVENOUS

## 2016-09-14 MED ORDER — LEVETIRACETAM 500 MG PO TABS
500.0000 mg | ORAL_TABLET | Freq: Two times a day (BID) | ORAL | Status: DC
  Administered 2016-09-14 – 2016-09-18 (×9): 500 mg via ORAL
  Filled 2016-09-14 (×9): qty 1

## 2016-09-14 MED ORDER — DILTIAZEM HCL ER COATED BEADS 240 MG PO CP24
240.0000 mg | ORAL_CAPSULE | Freq: Every day | ORAL | Status: DC
  Administered 2016-09-14 – 2016-09-18 (×5): 240 mg via ORAL
  Filled 2016-09-14 (×5): qty 1

## 2016-09-14 MED ORDER — METOPROLOL SUCCINATE ER 50 MG PO TB24
50.0000 mg | ORAL_TABLET | Freq: Every day | ORAL | Status: DC
  Administered 2016-09-14 – 2016-09-18 (×5): 50 mg via ORAL
  Filled 2016-09-14 (×5): qty 1

## 2016-09-14 MED ORDER — ONDANSETRON HCL 4 MG PO TABS: 4.0000 mg | ORAL_TABLET | Freq: Four times a day (QID) | ORAL | Status: DC | PRN

## 2016-09-14 MED ORDER — HYDROMORPHONE HCL 1 MG/ML IJ SOLN: 1.0000 mg | INTRAMUSCULAR | Status: DC | PRN

## 2016-09-14 MED ORDER — HYDROMORPHONE HCL 2 MG/ML IJ SOLN
1.0000 mg | INTRAMUSCULAR | Status: DC | PRN
  Administered 2016-09-14: 1 mg via INTRAVENOUS
  Filled 2016-09-14: qty 1

## 2016-09-14 MED ORDER — POLYVINYL ALCOHOL 1.4 % OP SOLN
1.0000 [drp] | Freq: Every day | OPHTHALMIC | Status: DC
  Administered 2016-09-14 – 2016-09-18 (×5): 1 [drp] via OPHTHALMIC
  Filled 2016-09-14: qty 15

## 2016-09-14 MED ORDER — LEVETIRACETAM 500 MG PO TABS: 500.0000 mg | ORAL_TABLET | Freq: Two times a day (BID) | ORAL | 2 refills | Status: AC

## 2016-09-14 MED ORDER — ALBUTEROL SULFATE (2.5 MG/3ML) 0.083% IN NEBU
2.5000 mg | INHALATION_SOLUTION | RESPIRATORY_TRACT | Status: DC | PRN
Start: 2016-09-14 — End: 2016-09-18

## 2016-09-14 MED ORDER — ACETAMINOPHEN 325 MG PO TABS
325.0000 mg | ORAL_TABLET | Freq: Four times a day (QID) | ORAL | Status: DC | PRN
Start: 2016-09-14 — End: 2016-09-18

## 2016-09-14 MED ORDER — PROPYLENE GLYCOL-GLYCERIN 0.6-0.6 % OP SOLN: 1.0000 [drp] | Freq: Every day | OPHTHALMIC | Status: DC

## 2016-09-14 MED ORDER — ONDANSETRON HCL 4 MG/2ML IJ SOLN: 4.0000 mg | Freq: Four times a day (QID) | INTRAMUSCULAR | Status: DC | PRN

## 2016-09-14 MED ORDER — LORAZEPAM 2 MG/ML IJ SOLN
0.5000 mg | Freq: Once | INTRAMUSCULAR | Status: AC
  Administered 2016-09-14: 0.5 mg via INTRAVENOUS

## 2016-09-14 MED ORDER — LORAZEPAM 2 MG/ML IJ SOLN
INTRAMUSCULAR | Status: AC
Start: 2016-09-14 — End: 2016-09-14
  Filled 2016-09-14: qty 1

## 2016-09-14 MED ORDER — HYDROCHLOROTHIAZIDE 25 MG PO TABS
12.5000 mg | ORAL_TABLET | Freq: Every day | ORAL | Status: DC
  Administered 2016-09-14 – 2016-09-16 (×3): 12.5 mg via ORAL
  Filled 2016-09-14 (×3): qty 1

## 2016-09-14 MED ORDER — DEXAMETHASONE SODIUM PHOSPHATE 4 MG/ML IJ SOLN
4.0000 mg | Freq: Four times a day (QID) | INTRAMUSCULAR | Status: DC
  Administered 2016-09-14 – 2016-09-16 (×11): 4 mg via INTRAVENOUS
  Filled 2016-09-14 (×11): qty 1

## 2016-09-14 MED ORDER — PANTOPRAZOLE SODIUM 40 MG PO TBEC
40.0000 mg | DELAYED_RELEASE_TABLET | Freq: Two times a day (BID) | ORAL | Status: DC
  Administered 2016-09-14 – 2016-09-18 (×9): 40 mg via ORAL
  Filled 2016-09-14 (×9): qty 1

## 2016-09-14 NOTE — ED Notes (Signed)
Pt is [name]. Admitting MD paged for meds.

## 2016-09-14 NOTE — ED Notes (Signed)
Patient transported to MRI 

## 2016-09-14 NOTE — Care Management Note (Signed)
Case Management Note  Patient Details  Name: [name] MRN: [id number] Date of Birth: 1930/04/21  Subjective/Objective:  Pt admitted on 09/13/16 with dizziness and fall; MRI showed Lt SDH and Lt posterior enhancing mass.  PTA, pt independent and lives at [facility].                    Action/Plan: Will follow for discharge planning as pt progresses.  PT/OT to evaluate for home needs.    Expected Discharge Date:                  Expected Discharge Plan:  Home/Self Care  In-House Referral:     Discharge planning Services  CM Consult  Post Acute Care Choice:    Choice offered to:     DME Arranged:    DME Agency:     HH Arranged:    HH Agency:     Status of Service:  In process, will continue to follow  If discussed at Long Length of Stay Meetings, dates discussed:    Additional Comments:  Reinaldo Raddle, RN, BSN  Trauma/Neuro ICU Case Manager [phone]

## 2016-09-14 NOTE — H&P (Signed)
History and Physical    [name] FFM:[id number] DOB: Jul 29, 1930 DOA: 09/13/2016  Referring MD/NP/PA: Dr. Alvino Chapel PCP: Sheila Channel, MD  Patient coming from: Home  Chief Complaint: Fall  HPI: [name] is a 82 y.o. female with medical history significant of HTN, atrial fibrillation on Xarelto, uterine cancer s/p and skin cancer; who presents after having a fall at home. Patient states that she was coming from and her back to her apartment when she had acute onset of right arm tingling sensation and became very dizzy and lightheaded. She lost consciousness and woke up on the ground. She hit the right side of her face and noted that she was bleeding significantly. Patient reports that she's had this right handed tingling sensation since the first part of November. Associated symptoms include decreased ability to utilize her right hand at times although she reports being right-handed. She was previously evaluated by the physician assistant and that they were unable to find a cause of her symptoms. Patient denies any  ED Course: Upon admission into the emergency department patient was seen have a 4 mm subdural hematoma and a left frontal mass with vasogenic edema. Neurosurgery was consulted and recommended repeat CT scan in a.m.  Review of Systems: As per HPI otherwise 10 point review of systems negative.   Past Medical History:  Diagnosis Date  . A-fib ([place])   . Coronary artery disease   . Hypertension   . MI (myocardial infarction)   . Skin cancer   . Uterine cancer [facility])     Past Surgical History:  Procedure Laterality Date  . ABDOMINAL HYSTERECTOMY    . APPENDECTOMY    . CORONARY ANGIOPLASTY WITH STENT PLACEMENT    . REPLACEMENT TOTAL KNEE    . TONSILLECTOMY       reports that she has never smoked. She does not have any smokeless tobacco history on file. Her alcohol and drug histories are not on file.  Allergies  Allergen Reactions  . Codeine Other (See Comments)   Possibly hives  . Dabigatran Hives    Reaction to Pradaxa  . Iodine Hives    Reaction to topical iodine  . Penicillins Hives    Has patient had a PCN reaction causing immediate rash, facial/tongue/throat swelling, SOB or lightheadedness with hypotension: Yes Has patient had a PCN reaction causing severe rash involving mucus membranes or skin necrosis: No Has patient had a PCN reaction that required hospitalization No Has patient had a PCN reaction occurring within the last 10 years: No If all of the above answers are "NO", then may proceed with Cephalosporin use.  . Sulfa Antibiotics Nausea And Vomiting    History reviewed. No pertinent family history.  Prior to Admission medications   Medication Sig Start Date End Date Taking? Authorizing Provider  acetaminophen (TYLENOL) 325 MG tablet Take 325-650 mg by mouth every 6 (six) hours as needed (pain).    Yes Historical Provider, MD  Ascorbic Acid (VITAMIN C) 1000 MG tablet Take 1,000 mg by mouth daily.   Yes Historical Provider, MD  aspirin EC 81 MG tablet Take 81 mg by mouth daily.   Yes Historical Provider, MD  atorvastatin (LIPITOR) 40 MG tablet Take 40 mg by mouth at bedtime.    Yes Historical Provider, MD  beta carotene w/minerals (OCUVITE) tablet Take 2 tablets by mouth daily.   Yes Historical Provider, MD  Calcium Carb-Cholecalciferol (CALCIUM 1000 + D PO) Take 1,000 mg by mouth daily.   Yes Historical Provider, MD  diltiazem (TIAZAC) 240 MG 24 hr capsule Take 240 mg by mouth daily.  07/07/16  Yes Historical Provider, MD  ezetimibe (ZETIA) 10 MG tablet Take 10 mg by mouth at bedtime.    Yes Historical Provider, MD  hydrochlorothiazide (HYDRODIURIL) 25 MG tablet Take 12.5 mg by mouth daily.    Yes Historical Provider, MD  metoprolol succinate (TOPROL-XL) 50 MG 24 hr tablet Take 50 mg by mouth daily.  07/29/16  Yes Historical Provider, MD  mirabegron ER (MYRBETRIQ) 50 MG TB24 tablet Take 50 mg by mouth daily.   Yes Historical Provider,  MD  omeprazole (PRILOSEC) 40 MG capsule Take 40 mg by mouth daily.   Yes Historical Provider, MD  Propylene Glycol-Glycerin (SOOTHE OP) Place 1 drop into both eyes daily.   Yes Historical Provider, MD  rivaroxaban (XARELTO) 20 MG TABS tablet Take 20 mg by mouth daily with breakfast.    Yes Historical Provider, MD  Zinc 50 MG TABS Take 50 mg by mouth daily.   Yes Historical Provider, MD    Physical Exam: Vitals:   09/13/16 2200 09/13/16 2215 09/13/16 2230 09/13/16 2245  BP: 152/65 171/78 153/80 152/70  Pulse: 62 71 60 63  Resp: '17 19 17 18  '$ SpO2: 96% 97% 93% 97%      Constitutional: NAD, calm, comfortable Vitals:   09/13/16 2200 09/13/16 2215 09/13/16 2230 09/13/16 2245  BP: 152/65 171/78 153/80 152/70  Pulse: 62 71 60 63  Resp: '17 19 17 18  '$ SpO2: 96% 97% 93% 97%   Eyes: PERRL, lids and conjunctivae normal ENMT: Mucous membranes are dry. Posterior pharynx clear of any exudate or lesions.Normal dentition.  Neck: Cervical neck tenderness is noted. Patient currently in cervical collar and therefore cannot assess for full range of motion.Marland Kitchen Respiratory: clear to auscultation bilaterally, no wheezing, no crackles. Normal respiratory effort. No accessory muscle use.  Cardiovascular: Regular rate and rhythm, no murmurs / rubs / gallops. No extremity edema. 2+ pedal pulses. No carotid bruits.  Abdomen: no tenderness, no masses palpated. No hepatosplenomegaly. Bowel sounds positive.  Musculoskeletal: no clubbing / cyanosis. No joint deformity upper and lower extremities. Good ROM, no contractures. Normal muscle tone.  Skin: ecchymoses to the left side of face and t anterior leg. Hematoma to the forehead. Laceration to the lower left eyelid. Neurologic: right sided upper extremity weakness 4+ out of 5. Psychiatric: Normal judgment and insight. Alert and oriented x 3. Normal mood.     Labs on Admission: I have personally reviewed following labs and imaging studies  CBC:  Recent  Labs Lab 09/13/16 2130  WBC 13.8*  NEUTROABS 11.9*  HGB 13.9  HCT 42.5  MCV 83.0  PLT 761   Basic Metabolic Panel:  Recent Labs Lab 09/13/16 2130  NA 136  K 3.6  CL 97*  CO2 31  GLUCOSE 180*  BUN 24*  CREATININE 0.81  CALCIUM 8.9   GFR: CrCl cannot be calculated (Unknown ideal weight.). Liver Function Tests:  Recent Labs Lab 09/13/16 2130  AST 28  ALT 11*  ALKPHOS 97  BILITOT 0.6  PROT 6.0*  ALBUMIN 3.4*   No results for input(s): LIPASE, AMYLASE in the last 168 hours. No results for input(s): AMMONIA in the last 168 hours. Coagulation Profile: No results for input(s): INR, PROTIME in the last 168 hours. Cardiac Enzymes:  Recent Labs Lab 09/13/16 2130  TROPONINI <0.03   BNP (last 3 results) No results for input(s): PROBNP in the last 8760 hours. HbA1C: No results for  input(s): HGBA1C in the last 72 hours. CBG: No results for input(s): GLUCAP in the last 168 hours. Lipid Profile: No results for input(s): CHOL, HDL, LDLCALC, TRIG, CHOLHDL, LDLDIRECT in the last 72 hours. Thyroid Function Tests: No results for input(s): TSH, T4TOTAL, FREET4, T3FREE, THYROIDAB in the last 72 hours. Anemia Panel: No results for input(s): VITAMINB12, FOLATE, FERRITIN, TIBC, IRON, RETICCTPCT in the last 72 hours. Urine analysis:    Component Value Date/Time   COLORURINE STRAW (A) 09/13/2016 2100   APPEARANCEUR CLEAR 09/13/2016 2100   LABSPEC 1.011 09/13/2016 2100   PHURINE 8.0 09/13/2016 2100   GLUCOSEU NEGATIVE 09/13/2016 2100   HGBUR LARGE (A) 09/13/2016 2100   BILIRUBINUR NEGATIVE 09/13/2016 2100   KETONESUR NEGATIVE 09/13/2016 2100   PROTEINUR NEGATIVE 09/13/2016 2100   UROBILINOGEN 1.0 02/24/2009 1527   NITRITE NEGATIVE 09/13/2016 2100   LEUKOCYTESUR NEGATIVE 09/13/2016 2100   Sepsis Labs: No results found for this or any previous visit (from the past 240 hour(s)).   Radiological Exams on Admission: Dg Chest 2 View  Result Date: 09/13/2016 CLINICAL  DATA:  Initial evaluation for acute dizziness, fall. EXAM: CHEST  2 VIEW COMPARISON:  Prior radiograph from 01/01/2009. FINDINGS: Moderate cardiomegaly, slightly progressed relative to most recent radiograph from 2010. Mediastinal silhouette within normal limits. Aortic atherosclerosis, also slightly progressed. Lungs normally inflated. Mild diffuse interstitial prominence, suggesting mild interstitial edema. No focal infiltrates. No significant pleural effusion. No pneumothorax. No acute osseous abnormality. IMPRESSION: 1. Cardiomegaly with mild diffuse interstitial congestion/edema. Cardiomegaly has mildly progressed relative to most recent radiograph from 01/01/2009. 2. No other active cardiopulmonary disease. 3. Aortic atherosclerosis, also progressed relative to 2010. Electronically Signed   By: Jeannine Boga M.D.   On: 09/13/2016 21:40   Dg Tibia/fibula Right  Result Date: 09/13/2016 CLINICAL DATA:  Initial evaluation for acute trauma, fall. EXAM: RIGHT TIBIA AND FIBULA - 2 VIEW COMPARISON:  None. FINDINGS: There is no evidence of fracture or other focal bone lesions. So no acute soft tissue abnormality about the leg scattered atheromatous vascular calcifications noted posterior to the knee. Advanced degenerative changes noted about the partially visualized knee. IMPRESSION: 1. No acute osseous abnormality about the right tibia/fibula. 2. Advanced degenerative osteoarthrosis about the right knee. 3. Atherosclerotic disease. Electronically Signed   By: Jeannine Boga M.D.   On: 09/13/2016 21:42   Ct Head Wo Contrast  Result Date: 09/13/2016 CLINICAL DATA:  82 year old female with dizziness and fall. History of breast cancer. EXAM: CT HEAD WITHOUT CONTRAST CT MAXILLOFACIAL WITHOUT CONTRAST CT CERVICAL SPINE WITHOUT CONTRAST TECHNIQUE: Multidetector CT imaging of the head, cervical spine, and maxillofacial structures were performed using the standard protocol without intravenous contrast.  Multiplanar CT image reconstructions of the cervical spine and maxillofacial structures were also generated. COMPARISON:  Head CT dated 02/24/2009 FINDINGS: CT HEAD FINDINGS Brain: There is a left frontotemporal subdural hemorrhage measuring up to 4 mm in thickness. Right anterior parafalcine subdural hemorrhage measures 4 mm in thickness. An area of white matter edema noted over the left frontal convexity compatible with vasogenic edema. There is mild mass effect and effacement of the adjacent sulci. A 1.2 x 0.9 cm density in the left frontal convexity (series 2, image 23 and sagittal series 5 image 41) is concerning for a primary neoplasm versus metastatic disease. Further evaluation with MRI without and with contrast is recommended. There is mild age-related atrophy and chronic microvascular ischemic changes. No midline shift noted. The quadrigeminal plate cistern is patent. Vascular: No hyperdense vessel or  unexpected calcification. Skull: Nondisplaced fracture of the right occipital condyle. No other calcaneal fracture. Other: Large left forehead hematoma. CT MAXILLOFACIAL FINDINGS Osseous: No fracture or mandibular dislocation. No destructive process. Chronic degenerative changes at the left TMJ. Orbits: Bilateral cataract surgeries noted. The globes and retro-orbital fat are preserved. Sinuses: Clear. Soft tissues: Left forehead hematoma. CT CERVICAL SPINE FINDINGS Alignment: No acute subluxation. Skull base and vertebrae: There is nondisplaced comminuted appearing fracture of the right occipital condyle. There is nondisplaced fracture of the right posterior arch of C1. There is incomplete bony fusion of the posterior ring of C1. There is type III fracture of the base of the odontoid process with approximately 2 mm posterior displacement of the tip of the odontoid. Evaluation for fracture is very limited due to advanced osteopenia. There is apparent ankylosis of the C2-C5 posterior elements related to chronic  changes and inflammation. Soft tissues and spinal canal: No prevertebral fluid or swelling. No visible canal hematoma. Disc levels:  Multilevel degenerative changes. Upper chest: Bilateral carotid bulb atherosclerotic disease. Other: None IMPRESSION: Left frontal white matter vasogenic edema with suspected underlying mass/metastatic disease. MRI without and with contrast is recommended for further evaluation. Small left frontotemporal subdural hemorrhage as well as anterior parafalcine subdural hemorrhage. No midline shift. Nondisplaced comminuted appearing fracture of the right occipital condyle. Nondisplaced fracture of the right posterior arch of C1. Type III fracture of the odontoid process with 2 mm posterior displacement. These results were called by telephone at the time of interpretation on 09/13/2016 at 9:42 pm to Dr. Davonna Belling , who verbally acknowledged these results. Electronically Signed   By: Anner Crete M.D.   On: 09/13/2016 21:48   Ct Cervical Spine Wo Contrast  Result Date: 09/13/2016 CLINICAL DATA:  82 year old female with dizziness and fall. History of breast cancer. EXAM: CT HEAD WITHOUT CONTRAST CT MAXILLOFACIAL WITHOUT CONTRAST CT CERVICAL SPINE WITHOUT CONTRAST TECHNIQUE: Multidetector CT imaging of the head, cervical spine, and maxillofacial structures were performed using the standard protocol without intravenous contrast. Multiplanar CT image reconstructions of the cervical spine and maxillofacial structures were also generated. COMPARISON:  Head CT dated 02/24/2009 FINDINGS: CT HEAD FINDINGS Brain: There is a left frontotemporal subdural hemorrhage measuring up to 4 mm in thickness. Right anterior parafalcine subdural hemorrhage measures 4 mm in thickness. An area of white matter edema noted over the left frontal convexity compatible with vasogenic edema. There is mild mass effect and effacement of the adjacent sulci. A 1.2 x 0.9 cm density in the left frontal convexity  (series 2, image 23 and sagittal series 5 image 41) is concerning for a primary neoplasm versus metastatic disease. Further evaluation with MRI without and with contrast is recommended. There is mild age-related atrophy and chronic microvascular ischemic changes. No midline shift noted. The quadrigeminal plate cistern is patent. Vascular: No hyperdense vessel or unexpected calcification. Skull: Nondisplaced fracture of the right occipital condyle. No other calcaneal fracture. Other: Large left forehead hematoma. CT MAXILLOFACIAL FINDINGS Osseous: No fracture or mandibular dislocation. No destructive process. Chronic degenerative changes at the left TMJ. Orbits: Bilateral cataract surgeries noted. The globes and retro-orbital fat are preserved. Sinuses: Clear. Soft tissues: Left forehead hematoma. CT CERVICAL SPINE FINDINGS Alignment: No acute subluxation. Skull base and vertebrae: There is nondisplaced comminuted appearing fracture of the right occipital condyle. There is nondisplaced fracture of the right posterior arch of C1. There is incomplete bony fusion of the posterior ring of C1. There is type III fracture of the base of  the odontoid process with approximately 2 mm posterior displacement of the tip of the odontoid. Evaluation for fracture is very limited due to advanced osteopenia. There is apparent ankylosis of the C2-C5 posterior elements related to chronic changes and inflammation. Soft tissues and spinal canal: No prevertebral fluid or swelling. No visible canal hematoma. Disc levels:  Multilevel degenerative changes. Upper chest: Bilateral carotid bulb atherosclerotic disease. Other: None IMPRESSION: Left frontal white matter vasogenic edema with suspected underlying mass/metastatic disease. MRI without and with contrast is recommended for further evaluation. Small left frontotemporal subdural hemorrhage as well as anterior parafalcine subdural hemorrhage. No midline shift. Nondisplaced comminuted  appearing fracture of the right occipital condyle. Nondisplaced fracture of the right posterior arch of C1. Type III fracture of the odontoid process with 2 mm posterior displacement. These results were called by telephone at the time of interpretation on 09/13/2016 at 9:42 pm to Dr. Davonna Belling , who verbally acknowledged these results. Electronically Signed   By: Anner Crete M.D.   On: 09/13/2016 21:48   Ct Maxillofacial Wo Contrast  Result Date: 09/13/2016 CLINICAL DATA:  82 year old female with dizziness and fall. History of breast cancer. EXAM: CT HEAD WITHOUT CONTRAST CT MAXILLOFACIAL WITHOUT CONTRAST CT CERVICAL SPINE WITHOUT CONTRAST TECHNIQUE: Multidetector CT imaging of the head, cervical spine, and maxillofacial structures were performed using the standard protocol without intravenous contrast. Multiplanar CT image reconstructions of the cervical spine and maxillofacial structures were also generated. COMPARISON:  Head CT dated 02/24/2009 FINDINGS: CT HEAD FINDINGS Brain: There is a left frontotemporal subdural hemorrhage measuring up to 4 mm in thickness. Right anterior parafalcine subdural hemorrhage measures 4 mm in thickness. An area of white matter edema noted over the left frontal convexity compatible with vasogenic edema. There is mild mass effect and effacement of the adjacent sulci. A 1.2 x 0.9 cm density in the left frontal convexity (series 2, image 23 and sagittal series 5 image 41) is concerning for a primary neoplasm versus metastatic disease. Further evaluation with MRI without and with contrast is recommended. There is mild age-related atrophy and chronic microvascular ischemic changes. No midline shift noted. The quadrigeminal plate cistern is patent. Vascular: No hyperdense vessel or unexpected calcification. Skull: Nondisplaced fracture of the right occipital condyle. No other calcaneal fracture. Other: Large left forehead hematoma. CT MAXILLOFACIAL FINDINGS Osseous: No  fracture or mandibular dislocation. No destructive process. Chronic degenerative changes at the left TMJ. Orbits: Bilateral cataract surgeries noted. The globes and retro-orbital fat are preserved. Sinuses: Clear. Soft tissues: Left forehead hematoma. CT CERVICAL SPINE FINDINGS Alignment: No acute subluxation. Skull base and vertebrae: There is nondisplaced comminuted appearing fracture of the right occipital condyle. There is nondisplaced fracture of the right posterior arch of C1. There is incomplete bony fusion of the posterior ring of C1. There is type III fracture of the base of the odontoid process with approximately 2 mm posterior displacement of the tip of the odontoid. Evaluation for fracture is very limited due to advanced osteopenia. There is apparent ankylosis of the C2-C5 posterior elements related to chronic changes and inflammation. Soft tissues and spinal canal: No prevertebral fluid or swelling. No visible canal hematoma. Disc levels:  Multilevel degenerative changes. Upper chest: Bilateral carotid bulb atherosclerotic disease. Other: None IMPRESSION: Left frontal white matter vasogenic edema with suspected underlying mass/metastatic disease. MRI without and with contrast is recommended for further evaluation. Small left frontotemporal subdural hemorrhage as well as anterior parafalcine subdural hemorrhage. No midline shift. Nondisplaced comminuted appearing fracture of the  right occipital condyle. Nondisplaced fracture of the right posterior arch of C1. Type III fracture of the odontoid process with 2 mm posterior displacement. These results were called by telephone at the time of interpretation on 09/13/2016 at 9:42 pm to Dr. Davonna Belling , who verbally acknowledged these results. Electronically Signed   By: Anner Crete M.D.   On: 09/13/2016 21:48    EKG: Independently reviewed. Atrial flutter  Assessment/Plan Fall with subdural hematoma with left frontal mass: Acute. Fall noted to  have 4 mm subdural hematoma with new left frontal mass. Patient noted to have right hand tingling since November. Previous history of skin and uterine cancers questioned metastatic cancer. - Admit to stepdown - Neuro checks - Discussed case with Dr. Nicole Kindred of neurology who recommended Decadron 4 mg IV every 6 hour. - Fentanyl IV prn pain - MRI of the brain with and without contrast - Neurosurgery consulted and to see an a.m.     Atrial fibrillation/ atrial flutter  - Held Xarelto - continue metoprolol and diltiazem   GERD - Pharmacy substitution for Protonix  History of uterine and skin cancer   DVT prophylaxis: SCDs Code Status: DO NOT RESUSCITATE  Family Communication: Skull spinal care with the patient and family present at bedside Disposition Plan: TBD Consults called: Neurosurgery Admission status: Inpatient   Norval Morton MD [facility] Pager [phone]  If 7PM-7AM, please contact night-coverage [email or web address] Password [email or web address]  09/14/2016, 12:20 AM

## 2016-09-14 NOTE — ED Notes (Signed)
Patient transported to CT 

## 2016-09-14 NOTE — ED Notes (Signed)
Pt ambulated to bathroom. 2 person assist. Nausea reported

## 2016-09-14 NOTE — Consult Note (Signed)
CC:  Chief Complaint  Patient presents with  . Fall  . Head Injury    HPI: [name] is a 82 y.o. female with dizziness and a fall.  She struck her head but did not lose consciousness.  She has had some tingling in her right arm as well as a tremor.  She complains of a headache and minimal neck pain.  PMH: Past Medical History:  Diagnosis Date  . A-fib ([place])   . Coronary artery disease   . Hypertension   . MI (myocardial infarction)   . Skin cancer   . Uterine cancer ([facility])     PSH: Past Surgical History:  Procedure Laterality Date  . ABDOMINAL HYSTERECTOMY    . APPENDECTOMY    . CORONARY ANGIOPLASTY WITH STENT PLACEMENT    . REPLACEMENT TOTAL KNEE    . TONSILLECTOMY      SH: Social History  Substance Use Topics  . Smoking status: Never Smoker  . Smokeless tobacco: Not on file  . Alcohol use Not on file    MEDS: Prior to Admission medications   Medication Sig Start Date End Date Taking? Authorizing Provider  acetaminophen (TYLENOL) 325 MG tablet Take 325-650 mg by mouth every 6 (six) hours as needed (pain).    Yes Historical Provider, MD  Ascorbic Acid (VITAMIN C) 1000 MG tablet Take 1,000 mg by mouth daily.   Yes Historical Provider, MD  aspirin EC 81 MG tablet Take 81 mg by mouth daily.   Yes Historical Provider, MD  atorvastatin (LIPITOR) 40 MG tablet Take 40 mg by mouth at bedtime.    Yes Historical Provider, MD  beta carotene w/minerals (OCUVITE) tablet Take 2 tablets by mouth daily.   Yes Historical Provider, MD  Calcium Carb-Cholecalciferol (CALCIUM 1000 + D PO) Take 1,000 mg by mouth daily.   Yes Historical Provider, MD  diltiazem (TIAZAC) 240 MG 24 hr capsule Take 240 mg by mouth daily.  07/07/16  Yes Historical Provider, MD  ezetimibe (ZETIA) 10 MG tablet Take 10 mg by mouth at bedtime.    Yes Historical Provider, MD  hydrochlorothiazide (HYDRODIURIL) 25 MG tablet Take 12.5 mg by mouth daily.    Yes Historical Provider, MD  metoprolol succinate  (TOPROL-XL) 50 MG 24 hr tablet Take 50 mg by mouth daily.  07/29/16  Yes Historical Provider, MD  mirabegron ER (MYRBETRIQ) 50 MG TB24 tablet Take 50 mg by mouth daily.   Yes Historical Provider, MD  omeprazole (PRILOSEC) 40 MG capsule Take 40 mg by mouth daily.   Yes Historical Provider, MD  Propylene Glycol-Glycerin (SOOTHE OP) Place 1 drop into both eyes daily.   Yes Historical Provider, MD  rivaroxaban (XARELTO) 20 MG TABS tablet Take 20 mg by mouth daily with breakfast.    Yes Historical Provider, MD  Zinc 50 MG TABS Take 50 mg by mouth daily.   Yes Historical Provider, MD    ALLERGY: Allergies  Allergen Reactions  . Codeine Other (See Comments)    Possibly hives  . Dabigatran Hives    Reaction to Pradaxa  . Iodine Hives    Reaction to topical iodine  . Penicillins Hives    Has patient had a PCN reaction causing immediate rash, facial/tongue/throat swelling, SOB or lightheadedness with hypotension: Yes Has patient had a PCN reaction causing severe rash involving mucus membranes or skin necrosis: No Has patient had a PCN reaction that required hospitalization No Has patient had a PCN reaction occurring within the last 10 years: No  If all of the above answers are "NO", then may proceed with Cephalosporin use.  . Sulfa Antibiotics Nausea And Vomiting    ROS: ROS  NEUROLOGIC EXAM: Awake, alert, oriented Memory and concentration grossly intact Speech fluent, appropriate CN grossly intact Motor exam: Upper Extremities Deltoid Bicep Tricep Grip  Right 5/5 5/5 5/5 5/5  Left 5/5 5/5 5/5 5/5   Lower Extremity IP Quad PF DF EHL  Right 5/5 5/5 5/5 5/5 5/5  Left 5/5 5/5 5/5 5/5 5/5   Sensation grossly intact to LT  IMAGING: I have independently reviewed her head and cervical spine CT and brain MRI.  She has a left frontotemporal and falcine subdural hematoma which is small and does not cause mass effect.  She has a type 2 odontoid fracture with minimal displacement.  She has a  right occipital condyle fracture and C1 ring fracture.  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  IMPRESSION: - 82 y.o. female with subdural hematoma which is stable and non-operative.  She has stable cervical spine fractures which can be treated in a collar.  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  She has been on Xarelto and there is no emergency to the tumor resection.  I'll see her back in clinic next week and we will schedule her to come back for surgery.  PLAN: - No need to repeat head CT - Keep collar for cervical fractures - Follow up with me next week for brain tumor resection planning - I will order a stereotactic planning MRI for this hospitalization, she can be discharged after that - Start Keppra 500 mg PO bid - Hold Xarelto

## 2016-09-14 NOTE — Progress Notes (Signed)
Clarified with Dr. Verlon Au if patient could be downgraded to floor orders. Updated on patient condition. Remains neurologically intact, VSS with HR brady at times to 55 and asymptomatic. Ok to transfer to floor per MD. Nursing to continue to monitor.

## 2016-09-14 NOTE — Progress Notes (Signed)
Dr. Verlon Au at bedside and updated on patient condition: aware patient BP 110-315X systolic, currently on scheudule Hctz and troprol. Also made aware per [facility]'s policy patient can have another MRI in 48 hrs of last MRI which was done early this morning at 0230. Continuing to monitor.

## 2016-09-14 NOTE — H&P (Signed)
PROGRESS NOTE    [name]  DGU:[id number] DOB: 1930-02-03 DOA: 09/13/2016 PCP: Yong Channel, MD    Brief Narrative:  82 y/o ? Known h/o essential tremor Urge incontinence Known L Carotid bruit Hematochezia 11/2015 with prior adenoma-missed a colonoscopy Prior MI with RCA stent CHr A flutter on xarelto hld  gerd  Admitted with fall and significant hematomas to head and also found to have potential brain mass Placed on decadron as well as keppra Ns consulted   Assessment & Plan:   Principal Problem:   Subdural hematoma ([place]) Active Problems:   A-fib ([place])   Coronary artery disease   Hypertension   MI (myocardial infarction)   Uterine cancer ([place])   SDH with Mass-appreciate NS input.  started keppra 500 bid.  Cont decadron 4 mg iv q6.  Needs MRI 48 hrs-will obtain in house if still in hospital otherwise can schedule as OP-start soft diet Fall-pain 8/10-start percocet 10 q4 prn, Dilaudid for severe pain. Aflutter/fib CHad2Vasc2 score >4-hold xarelto indef for now.--NS to determine best time to resume.  Cont metorpolol 50 qd, Cardizem 240 qd Htn cont hctz 12.5-might need to increase dose Cervical Fractures-will need aspen collar until seen again by NS HLD-cont atorvastatin 40, zetia 10 GERD protonix 40 bid    DVT prophylaxis: scd Code Status: DNR Family Communication: no family + Disposition Plan: unclear   Consultants:   ns  Procedures:   mri  Antimicrobials:   none    Subjective: Fair Pain is not controlled   Objective: Vitals:   09/14/16 0500 09/14/16 0530 09/14/16 0600 09/14/16 0700  BP: (!) 143/65 (!) 159/71 (!) 161/68 (!) 163/73  Pulse: 62 65 64 64  Resp: '11 11 11 11  '$ Temp:      TempSrc:      SpO2: 99% 100% 100% 100%  Weight:      Height:       No intake or output data in the 24 hours ending 09/14/16 0745 Filed Weights   09/14/16 0335  Weight: 87 kg (191 lb 12.8 oz)    Examination:  General exam: Appears calm and  comfortable , L eye hematoma.  Forehead hematoma noted Respiratory system: Clear to auscultation. Respiratory effort normal. Cardiovascular system: S1 & S2 heard, RRR. No JVD, murmurs, rubs, gallops or clicks. No pedal edema. Gastrointestinal system: Abdomen is nondistended, soft and nontender. No organomegaly or masses felt. Normal bowel sounds heard. Central nervous system: Alert and oriented. No focal neurological deficits. Extremities: Symmetric 5 x 5 power. Sensory intact, Knee jerk equivocal Skin: No rashes, lesions or ulcers Psychiatry: Judgement and insight appear normal. Mood & affect appropriate.     Data Reviewed: I have personally reviewed following labs and imaging studies  CBC:  Recent Labs Lab 09/13/16 2130 09/14/16 0421  WBC 13.8* 8.8  NEUTROABS 11.9*  --   HGB 13.9 12.7  HCT 42.5 39.6  MCV 83.0 83.7  PLT 218 956   Basic Metabolic Panel:  Recent Labs Lab 09/13/16 2130 09/14/16 0421  NA 136 134*  K 3.6 3.4*  CL 97* 95*  CO2 31 28  GLUCOSE 180* 164*  BUN 24* 17  CREATININE 0.81 0.67  CALCIUM 8.9 8.4*   GFR: Estimated Creatinine Clearance: 59.3 mL/min (by C-G formula based on SCr of 0.67 mg/dL). Liver Function Tests:  Recent Labs Lab 09/13/16 2130  AST 28  ALT 11*  ALKPHOS 97  BILITOT 0.6  PROT 6.0*  ALBUMIN 3.4*   No results for input(s): LIPASE, AMYLASE in  the last 168 hours. No results for input(s): AMMONIA in the last 168 hours. Coagulation Profile: No results for input(s): INR, PROTIME in the last 168 hours. Cardiac Enzymes:  Recent Labs Lab 09/13/16 2130  TROPONINI <0.03   BNP (last 3 results) No results for input(s): PROBNP in the last 8760 hours. HbA1C: No results for input(s): HGBA1C in the last 72 hours. CBG: No results for input(s): GLUCAP in the last 168 hours. Lipid Profile: No results for input(s): CHOL, HDL, LDLCALC, TRIG, CHOLHDL, LDLDIRECT in the last 72 hours. Thyroid Function Tests: No results for input(s):  TSH, T4TOTAL, FREET4, T3FREE, THYROIDAB in the last 72 hours. Anemia Panel: No results for input(s): VITAMINB12, FOLATE, FERRITIN, TIBC, IRON, RETICCTPCT in the last 72 hours. Sepsis Labs: No results for input(s): PROCALCITON, LATICACIDVEN in the last 168 hours.  No results found for this or any previous visit (from the past 240 hour(s)).       Radiology Studies: Dg Chest 2 View  Result Date: 09/13/2016 CLINICAL DATA:  Initial evaluation for acute dizziness, fall. EXAM: CHEST  2 VIEW COMPARISON:  Prior radiograph from 01/01/2009. FINDINGS: Moderate cardiomegaly, slightly progressed relative to most recent radiograph from 2010. Mediastinal silhouette within normal limits. Aortic atherosclerosis, also slightly progressed. Lungs normally inflated. Mild diffuse interstitial prominence, suggesting mild interstitial edema. No focal infiltrates. No significant pleural effusion. No pneumothorax. No acute osseous abnormality. IMPRESSION: 1. Cardiomegaly with mild diffuse interstitial congestion/edema. Cardiomegaly has mildly progressed relative to most recent radiograph from 01/01/2009. 2. No other active cardiopulmonary disease. 3. Aortic atherosclerosis, also progressed relative to 2010. Electronically Signed   By: Jeannine Boga M.D.   On: 09/13/2016 21:40   Dg Tibia/fibula Right  Result Date: 09/13/2016 CLINICAL DATA:  Initial evaluation for acute trauma, fall. EXAM: RIGHT TIBIA AND FIBULA - 2 VIEW COMPARISON:  None. FINDINGS: There is no evidence of fracture or other focal bone lesions. So no acute soft tissue abnormality about the leg scattered atheromatous vascular calcifications noted posterior to the knee. Advanced degenerative changes noted about the partially visualized knee. IMPRESSION: 1. No acute osseous abnormality about the right tibia/fibula. 2. Advanced degenerative osteoarthrosis about the right knee. 3. Atherosclerotic disease. Electronically Signed   By: Jeannine Boga  M.D.   On: 09/13/2016 21:42   Ct Head Wo Contrast  Result Date: 09/13/2016 CLINICAL DATA:  82 year old female with dizziness and fall. History of breast cancer. EXAM: CT HEAD WITHOUT CONTRAST CT MAXILLOFACIAL WITHOUT CONTRAST CT CERVICAL SPINE WITHOUT CONTRAST TECHNIQUE: Multidetector CT imaging of the head, cervical spine, and maxillofacial structures were performed using the standard protocol without intravenous contrast. Multiplanar CT image reconstructions of the cervical spine and maxillofacial structures were also generated. COMPARISON:  Head CT dated 02/24/2009 FINDINGS: CT HEAD FINDINGS Brain: There is a left frontotemporal subdural hemorrhage measuring up to 4 mm in thickness. Right anterior parafalcine subdural hemorrhage measures 4 mm in thickness. An area of white matter edema noted over the left frontal convexity compatible with vasogenic edema. There is mild mass effect and effacement of the adjacent sulci. A 1.2 x 0.9 cm density in the left frontal convexity (series 2, image 23 and sagittal series 5 image 41) is concerning for a primary neoplasm versus metastatic disease. Further evaluation with MRI without and with contrast is recommended. There is mild age-related atrophy and chronic microvascular ischemic changes. No midline shift noted. The quadrigeminal plate cistern is patent. Vascular: No hyperdense vessel or unexpected calcification. Skull: Nondisplaced fracture of the right occipital condyle. No other  calcaneal fracture. Other: Large left forehead hematoma. CT MAXILLOFACIAL FINDINGS Osseous: No fracture or mandibular dislocation. No destructive process. Chronic degenerative changes at the left TMJ. Orbits: Bilateral cataract surgeries noted. The globes and retro-orbital fat are preserved. Sinuses: Clear. Soft tissues: Left forehead hematoma. CT CERVICAL SPINE FINDINGS Alignment: No acute subluxation. Skull base and vertebrae: There is nondisplaced comminuted appearing fracture of the  right occipital condyle. There is nondisplaced fracture of the right posterior arch of C1. There is incomplete bony fusion of the posterior ring of C1. There is type III fracture of the base of the odontoid process with approximately 2 mm posterior displacement of the tip of the odontoid. Evaluation for fracture is very limited due to advanced osteopenia. There is apparent ankylosis of the C2-C5 posterior elements related to chronic changes and inflammation. Soft tissues and spinal canal: No prevertebral fluid or swelling. No visible canal hematoma. Disc levels:  Multilevel degenerative changes. Upper chest: Bilateral carotid bulb atherosclerotic disease. Other: None IMPRESSION: Left frontal white matter vasogenic edema with suspected underlying mass/metastatic disease. MRI without and with contrast is recommended for further evaluation. Small left frontotemporal subdural hemorrhage as well as anterior parafalcine subdural hemorrhage. No midline shift. Nondisplaced comminuted appearing fracture of the right occipital condyle. Nondisplaced fracture of the right posterior arch of C1. Type III fracture of the odontoid process with 2 mm posterior displacement. These results were called by telephone at the time of interpretation on 09/13/2016 at 9:42 pm to Dr. Davonna Belling , who verbally acknowledged these results. Electronically Signed   By: Anner Crete M.D.   On: 09/13/2016 21:48   Ct Cervical Spine Wo Contrast  Result Date: 09/13/2016 CLINICAL DATA:  82 year old female with dizziness and fall. History of breast cancer. EXAM: CT HEAD WITHOUT CONTRAST CT MAXILLOFACIAL WITHOUT CONTRAST CT CERVICAL SPINE WITHOUT CONTRAST TECHNIQUE: Multidetector CT imaging of the head, cervical spine, and maxillofacial structures were performed using the standard protocol without intravenous contrast. Multiplanar CT image reconstructions of the cervical spine and maxillofacial structures were also generated. COMPARISON:   Head CT dated 02/24/2009 FINDINGS: CT HEAD FINDINGS Brain: There is a left frontotemporal subdural hemorrhage measuring up to 4 mm in thickness. Right anterior parafalcine subdural hemorrhage measures 4 mm in thickness. An area of white matter edema noted over the left frontal convexity compatible with vasogenic edema. There is mild mass effect and effacement of the adjacent sulci. A 1.2 x 0.9 cm density in the left frontal convexity (series 2, image 23 and sagittal series 5 image 41) is concerning for a primary neoplasm versus metastatic disease. Further evaluation with MRI without and with contrast is recommended. There is mild age-related atrophy and chronic microvascular ischemic changes. No midline shift noted. The quadrigeminal plate cistern is patent. Vascular: No hyperdense vessel or unexpected calcification. Skull: Nondisplaced fracture of the right occipital condyle. No other calcaneal fracture. Other: Large left forehead hematoma. CT MAXILLOFACIAL FINDINGS Osseous: No fracture or mandibular dislocation. No destructive process. Chronic degenerative changes at the left TMJ. Orbits: Bilateral cataract surgeries noted. The globes and retro-orbital fat are preserved. Sinuses: Clear. Soft tissues: Left forehead hematoma. CT CERVICAL SPINE FINDINGS Alignment: No acute subluxation. Skull base and vertebrae: There is nondisplaced comminuted appearing fracture of the right occipital condyle. There is nondisplaced fracture of the right posterior arch of C1. There is incomplete bony fusion of the posterior ring of C1. There is type III fracture of the base of the odontoid process with approximately 2 mm posterior displacement of the tip  of the odontoid. Evaluation for fracture is very limited due to advanced osteopenia. There is apparent ankylosis of the C2-C5 posterior elements related to chronic changes and inflammation. Soft tissues and spinal canal: No prevertebral fluid or swelling. No visible canal hematoma.  Disc levels:  Multilevel degenerative changes. Upper chest: Bilateral carotid bulb atherosclerotic disease. Other: None IMPRESSION: Left frontal white matter vasogenic edema with suspected underlying mass/metastatic disease. MRI without and with contrast is recommended for further evaluation. Small left frontotemporal subdural hemorrhage as well as anterior parafalcine subdural hemorrhage. No midline shift. Nondisplaced comminuted appearing fracture of the right occipital condyle. Nondisplaced fracture of the right posterior arch of C1. Type III fracture of the odontoid process with 2 mm posterior displacement. These results were called by telephone at the time of interpretation on 09/13/2016 at 9:42 pm to Dr. Davonna Belling , who verbally acknowledged these results. Electronically Signed   By: Anner Crete M.D.   On: 09/13/2016 21:48   Mr Jeri Cos NH Contrast  Result Date: 09/14/2016 CLINICAL DATA:  Initial evaluation for brain mass. EXAM: MRI HEAD WITHOUT AND WITH CONTRAST TECHNIQUE: Multiplanar, multiecho pulse sequences of the brain and surrounding structures were obtained without and with intravenous contrast. CONTRAST:  63m MULTIHANCE GADOBENATE DIMEGLUMINE 529 MG/ML IV SOLN COMPARISON:  Prior CT from 09/13/2016. FINDINGS: Brain: Study degraded by motion artifact. Cerebral volume within normal limits for age. Patchy T2/FLAIR hyperintensity within the periventricular and deep white matter both cerebral hemispheres most consistent with chronic small vessel ischemic disease, mild in nature. There is a heterogeneous enhancing mass centered at the left frontal parietal convexity that measures 21 x 22 x 16 mm. Associated vasogenic edema within the left frontotemporal region without significant mass effect. No other mass lesion or abnormal enhancement identified. Differential considerations include possible primary CNS neoplasm or solitary metastasis. Diffusion-weighted imaging demonstrates no evidence for  acute ischemic infarct. Acute subdural hematoma overlying the left frontotemporal convexity again noted. This measures up to 5 mm in maximal thickness. No midline shift. No hydrocephalus. Incidental note made of a partially empty sella. Midline structures intact. Vascular: Major intracranial vascular flow voids are preserved. Skull and upper cervical spine: Acute odontoid fracture noted, better evaluated on prior CT. Right occipital condyle fracture also better visualized on CT. Craniocervical junction remains widely patent. Visualized upper cervical spine within normal limits without acute injury. Large hematoma at the left frontal scalp with associated swelling. Sinuses/Orbits: Globes intact. No definite retro-orbital pathology. Mild left-sided proptosis. Patient is status post lens extraction. Paranasal sinuses are clear. No mastoid effusion. Inner ear structures normal. IMPRESSION: 1. 21 x 22 x 16 mm heterogeneous Lee enhancing mass at the left frontal parietal convexity with associated vasogenic edema. No other intracranial mass lesion identified. Finding may reflect a solitary intracranial metastasis or primary CNS neoplasm. 2. Acute left frontotemporal subdural hematoma measuring up to 5 mm without significant mass effect. No midline shift. 3. Acute odontoid and right occipital condyle fractures, better evaluated on prior CT. Craniocervical juncture remains widely patent, with no MRI evidence for acute injury to the visualized upper cervical spinal cord. 4. Large hematoma at the left frontal scalp. Electronically Signed   By: BJeannine BogaM.D.   On: 09/14/2016 03:55   Ct Maxillofacial Wo Contrast  Result Date: 09/13/2016 CLINICAL DATA:  82year old female with dizziness and fall. History of breast cancer. EXAM: CT HEAD WITHOUT CONTRAST CT MAXILLOFACIAL WITHOUT CONTRAST CT CERVICAL SPINE WITHOUT CONTRAST TECHNIQUE: Multidetector CT imaging of the head, cervical spine, and  maxillofacial structures  were performed using the standard protocol without intravenous contrast. Multiplanar CT image reconstructions of the cervical spine and maxillofacial structures were also generated. COMPARISON:  Head CT dated 02/24/2009 FINDINGS: CT HEAD FINDINGS Brain: There is a left frontotemporal subdural hemorrhage measuring up to 4 mm in thickness. Right anterior parafalcine subdural hemorrhage measures 4 mm in thickness. An area of white matter edema noted over the left frontal convexity compatible with vasogenic edema. There is mild mass effect and effacement of the adjacent sulci. A 1.2 x 0.9 cm density in the left frontal convexity (series 2, image 23 and sagittal series 5 image 41) is concerning for a primary neoplasm versus metastatic disease. Further evaluation with MRI without and with contrast is recommended. There is mild age-related atrophy and chronic microvascular ischemic changes. No midline shift noted. The quadrigeminal plate cistern is patent. Vascular: No hyperdense vessel or unexpected calcification. Skull: Nondisplaced fracture of the right occipital condyle. No other calcaneal fracture. Other: Large left forehead hematoma. CT MAXILLOFACIAL FINDINGS Osseous: No fracture or mandibular dislocation. No destructive process. Chronic degenerative changes at the left TMJ. Orbits: Bilateral cataract surgeries noted. The globes and retro-orbital fat are preserved. Sinuses: Clear. Soft tissues: Left forehead hematoma. CT CERVICAL SPINE FINDINGS Alignment: No acute subluxation. Skull base and vertebrae: There is nondisplaced comminuted appearing fracture of the right occipital condyle. There is nondisplaced fracture of the right posterior arch of C1. There is incomplete bony fusion of the posterior ring of C1. There is type III fracture of the base of the odontoid process with approximately 2 mm posterior displacement of the tip of the odontoid. Evaluation for fracture is very limited due to advanced osteopenia. There  is apparent ankylosis of the C2-C5 posterior elements related to chronic changes and inflammation. Soft tissues and spinal canal: No prevertebral fluid or swelling. No visible canal hematoma. Disc levels:  Multilevel degenerative changes. Upper chest: Bilateral carotid bulb atherosclerotic disease. Other: None IMPRESSION: Left frontal white matter vasogenic edema with suspected underlying mass/metastatic disease. MRI without and with contrast is recommended for further evaluation. Small left frontotemporal subdural hemorrhage as well as anterior parafalcine subdural hemorrhage. No midline shift. Nondisplaced comminuted appearing fracture of the right occipital condyle. Nondisplaced fracture of the right posterior arch of C1. Type III fracture of the odontoid process with 2 mm posterior displacement. These results were called by telephone at the time of interpretation on 09/13/2016 at 9:42 pm to Dr. Davonna Belling , who verbally acknowledged these results. Electronically Signed   By: Anner Crete M.D.   On: 09/13/2016 21:48        Scheduled Meds: . atorvastatin  40 mg Oral q1800  . dexamethasone  4 mg Intravenous Q6H  . diltiazem  240 mg Oral Daily  . ezetimibe  10 mg Oral QHS  . hydrochlorothiazide  12.5 mg Oral Daily  . levETIRAcetam  500 mg Oral BID  . LORazepam      . metoprolol succinate  50 mg Oral Daily  . pantoprazole  40 mg Oral BID  . polyvinyl alcohol  1 drop Both Eyes Daily   Continuous Infusions:   LOS: 0 days    Time spent: 30    Nita Sells, MD [facility] Pager [phone]  If 7PM-7AM, please contact night-coverage [email or web address] Password [email or web address] 09/14/2016, 7:45 AM

## 2016-09-15 ENCOUNTER — Inpatient Hospital Stay (HOSPITAL_COMMUNITY): Payer: Medicare HMO

## 2016-09-15 LAB — CBC
HEMATOCRIT: 37.3 % (ref 36.0–46.0)
Hemoglobin: 12.3 g/dL (ref 12.0–15.0)
MCH: 26.9 pg (ref 26.0–34.0)
MCHC: 33 g/dL (ref 30.0–36.0)
MCV: 81.4 fL (ref 78.0–100.0)
PLATELETS: 218 10*3/uL (ref 150–400)
RBC: 4.58 MIL/uL (ref 3.87–5.11)
RDW: 14.7 % (ref 11.5–15.5)
WBC: 9.8 10*3/uL (ref 4.0–10.5)

## 2016-09-15 MED ORDER — GADOBENATE DIMEGLUMINE 529 MG/ML IV SOLN
20.0000 mL | Freq: Once | INTRAVENOUS | Status: AC
  Administered 2016-09-15: 18 mL via INTRAVENOUS

## 2016-09-15 MED ORDER — LORAZEPAM 2 MG/ML IJ SOLN
INTRAMUSCULAR | Status: AC
  Filled 2016-09-15: qty 1

## 2016-09-15 MED ORDER — LORAZEPAM 2 MG/ML IJ SOLN
1.0000 mg | Freq: Once | INTRAMUSCULAR | Status: AC
  Administered 2016-09-15: 1 mg via INTRAVENOUS

## 2016-09-15 NOTE — Evaluation (Signed)
Occupational Therapy Evaluation Patient Details Name: [name] MRN: [id number] DOB: 05-05-1930 Today's Date: 09/15/2016    History of Present Illness Pt admitted on 09/13/16 with dizziness and fall. She struck her head but did not lose consciousness. She has had some tingling in her R arm as well as a tremor. MRI showed Lt SDH and Lt posterior enhancing mass.  PTA, pt independent and lives at [facility].             Clinical Impression   Pt with decline in function and safety with ADLs and ADL mobility with decreased strength, balance and endurance. Eval limited by pt's pain and fatigue this afternoon. PT worked with PT before lunch. Pt would benefit from acute OT services to address impairments to increase level of function and safety    Follow Up Recommendations  SNF    Equipment Recommendations  None recommended by OT;Other (comment) (TBD at next venue of care)    Recommendations for Other Services       Precautions / Restrictions Precautions Precautions: Fall Precaution Comments: educated pt on cervical/neck brace precautions, provided handout Required Braces or Orthoses: Other Brace/Splint Other Brace/Splint: [place] collar Restrictions Weight Bearing Restrictions: No      Mobility Bed Mobility Overal bed mobility: Needs Assistance Bed Mobility: Rolling Rolling: Mod assist   Supine to sit: Min assist     General bed mobility comments: attempted sup - sit x 2. Pt reports fatigue and overall not feeling well this afternoon  Transfers                 General transfer comment: NT, pt unable at this time    Balance Overall balance assessment: Needs assistance Sitting-balance support: Feet supported;Single extremity supported Sitting balance-Leahy Scale: Fair Sitting balance - Comments: NT, pt unable after 2 attempts. Per PT note, pt has Fair sitting balance                                    ADL Overall  ADL's : Needs assistance/impaired     Grooming: Wash/dry hands;Wash/dry face;Bed level;Set up   Upper Body Bathing: Bed level;Total assistance   Lower Body Bathing: Total assistance   Upper Body Dressing : Total assistance   Lower Body Dressing: Total assistance     Toilet Transfer Details (indicate cue type and reason): NT Toileting- Clothing Manipulation and Hygiene: Bed level;Total assistance       Functional mobility during ADLs:  (NT, pt unable)       Vision Vision Assessment?: No apparent visual deficits              Pertinent Vitals/Pain Pain Assessment: 0-10 Pain Score: 4  Pain Location: neck and back Pain Descriptors / Indicators: Aching;Sore;Grimacing;Guarding Pain Intervention(s): Limited activity within patient's tolerance;Monitored during session;Premedicated before session;Repositioned     Hand Dominance Right   Extremity/Trunk Assessment Upper Extremity Assessment Upper Extremity Assessment: Generalized weakness   Lower Extremity Assessment Lower Extremity Assessment: Defer to PT evaluation       Communication Communication Communication: No difficulties   Cognition Arousal/Alertness: Awake/alert Behavior During Therapy: WFL for tasks assessed/performed Overall Cognitive Status: Within Functional Limits for tasks assessed                     General Comments   pt pleasant and cooperative  Home Living Family/patient expects to be discharged to:: Assisted living ([facility] apt) Living Arrangements: Alone Available Help at Discharge: Available 24 hours/day Type of Home: Independent living facility             Bathroom Shower/Tub: Walk-in shower   Bathroom Toilet: Handicapped height     Home Equipment: [place] - single point;Grab bars - toilet;Grab bars - tub/shower;Hand held shower head;Adaptive equipment Adaptive Equipment: Reacher        Prior Functioning/Environment Level of  Independence: Independent with assistive device(s)                 OT Problem List: Decreased strength;Impaired balance (sitting and/or standing);Decreased activity tolerance;Decreased knowledge of use of DME or AE;Pain   OT Treatment/Interventions: Self-care/ADL training;DME and/or AE instruction;Therapeutic activities;Patient/family education    OT Goals(Current goals can be found in the care plan section) Acute Rehab OT Goals Patient Stated Goal: to be able to walk; become independent again OT Goal Formulation: With patient Time For Goal Achievement: 09/22/16 Potential to Achieve Goals: Good ADL Goals Pt Will Perform Grooming: with min assist;with min guard assist;sitting Pt Will Perform Upper Body Bathing: with max assist;with mod assist;sitting Pt Will Perform Upper Body Dressing: with max assist;with mod assist;sitting Pt Will Perform Lower Body Dressing: with max assist;sitting/lateral leans Pt Will Transfer to Toilet: with max assist;with mod assist;bedside commode Additional ADL Goal #1: Pt will demonstrate proper techniques for bed mobility to sit EOB for ADL tasks  OT Frequency: Min 2X/week   Barriers to D/C: Decreased caregiver support                        End of Session Equipment Utilized During Treatment: Cervical collar  Activity Tolerance: Patient limited by pain;Patient limited by fatigue Patient left: in bed;with call bell/phone within reach   Time: 1353-1413 OT Time Calculation (min): 20 min Charges:  OT General Charges $OT Visit: 1 Procedure OT Evaluation $OT Eval Moderate Complexity: 1 Procedure G-Codes:    Britt Bottom 09/15/2016, 2:24 PM

## 2016-09-15 NOTE — Progress Notes (Signed)
Physical Therapy Evaluation Patient Details Name: [name] MRN: [id number] DOB: December 14, 1929 Today's Date: 09/15/2016   History of Present Illness  Pt admitted on 09/13/16 with dizziness and fall. She struck her head but did not lose consciousness. She has had some tingling in her R arm as well as a tremor. MRI showed Lt SDH and Lt posterior enhancing mass.  PTA, pt independent and lives at [facility].            Clinical Impression  PTA, pt was independent with all ADLs and community ambulation with SPC. Pt currently lives independently in an assisted living facility. However, pt notes that this facility is also a SNF and can provide 24/7 care if notified. Pt able to sit up for about 5 minutes today before c/o fatigue and dizziness. Pt SpO2 at 98% and BP at 138/66. Was not able to stand/ambulate as a result. Pt would benefit from SNF following hospital d/c to address decreased activity tolerance and strength before return to previous function. PT will continue to follow acutely to maximize gains before d/c to next venue.     Follow Up Recommendations SNF    Equipment Recommendations  Rolling walker with 5" wheels    Recommendations for Other Services       Precautions / Restrictions Precautions Precautions: Fall Required Braces or Orthoses: Other Brace/Splint Other Brace/Splint: [place] collar Restrictions Weight Bearing Restrictions: No      Mobility  Bed Mobility Overal bed mobility: Needs Assistance Bed Mobility: Supine to Sit     Supine to sit: Min assist     General bed mobility comments: Min A for stability while elevating trunk.   Transfers                    Ambulation/Gait                Stairs            Wheelchair Mobility    Modified Rankin (Stroke Patients Only)       Balance Overall balance assessment: Needs assistance Sitting-balance support: Feet supported;Single extremity supported Sitting  balance-Leahy Scale: Fair Sitting balance - Comments: Pt fatigued after maintaining sitting balance for ~5 minutes. Pt c/o dizziness. SpO2 98% and BP 138/66 at the time                                     Pertinent Vitals/Pain Pain Assessment: 0-10 Pain Score: 3  Pain Location: neck and back Pain Descriptors / Indicators: [place] expects to be discharged to:: Assisted living ([place] [place] [facility]) Living Arrangements: Alone Available Help at Discharge: Available 24 hours/day (she would like to call her facility to set up the 24/7 care) Type of Home: Assisted living         Home Equipment: Cane - single point;Grab bars - toilet;Grab bars - tub/shower;Hand held shower head      Prior Function Level of Independence: Independent with assistive device(s)               Hand Dominance   Dominant Hand: Right    Extremity/Trunk Assessment   Upper Extremity Assessment Upper Extremity Assessment: Defer to OT evaluation    Lower Extremity Assessment Lower Extremity Assessment: Generalized weakness (Difficult to assess as pt fatigued before getting OOB)       Communication   Communication: No  difficulties  Cognition Arousal/Alertness: Awake/alert Behavior During Therapy: WFL for tasks assessed/performed Overall Cognitive Status: Within Functional Limits for tasks assessed                      General Comments      Exercises     Assessment/Plan    PT Assessment Patient needs continued PT services  PT Problem List Decreased strength;Decreased range of motion;Decreased activity tolerance;Decreased balance;Decreased mobility;Decreased knowledge of use of DME;Decreased safety awareness;Decreased knowledge of precautions;Pain          PT Treatment Interventions DME instruction;Gait training;Therapeutic activities;Functional mobility training;Therapeutic exercise;Balance training;Patient/family education     PT Goals (Current goals can be found in the Care Plan section)  Acute Rehab PT Goals Patient Stated Goal: to be able to walk; become independent PT Goal Formulation: With patient Time For Goal Achievement: 09/29/16 Potential to Achieve Goals: Fair    Frequency Min 3X/week   Barriers to discharge        Co-evaluation               End of Session   Activity Tolerance: Patient limited by fatigue Patient left: in bed;with call bell/phone within reach;with SCD's reapplied Nurse Communication: Mobility status         Time: 1200-1218 PT Time Calculation (min) (ACUTE ONLY): 18 min   Charges:   PT Evaluation $PT Eval Moderate Complexity: 1 Procedure     PT G Codes:        Tonia Brooms September 18, 2016, 1:51 PM Tonia Brooms, SPT [phone]

## 2016-09-15 NOTE — H&P (Signed)
PROGRESS NOTE    [name]  UDJ:[id number] DOB: 1929-11-01 DOA: 09/13/2016 PCP: Yong Channel, MD    Brief Narrative:  82 y/o ? Known h/o essential tremor Urge incontinence Known L Carotid bruit Hematochezia 11/2015 with prior adenoma-missed a colonoscopy Prior MI with RCA stent CHr A flutter on xarelto hld  gerd  Admitted with fall and significant hematomas to head and also found to have potential brain mass Placed on decadron as well as keppra Ns consulted   Assessment & Plan:   Principal Problem:   Subdural hematoma ([place]) Active Problems:   A-fib ([place])   Coronary artery disease   Hypertension   MI (myocardial infarction)   Uterine cancer ([place])   Intracranial mass   SDH with Mass-appreciate NS input.  started keppra 500 bid.  Cont decadron 4 mg iv q6.  Needs MRI 48 hrs-will obtain 12/21-start soft diet.  Will need SNF per compelte PT evala nd SW aware Fall-pain 8/10-start percocet 10 q4 prn, Dilaudid for severe pain. Aflutter/fib CHad2Vasc2 score >4-hold xarelto indef for now.--NS to determine best time to resume.  Cont metorpolol 50 qd, Cardizem 240 qd Htn cont hctz 12.5-might need to increase dose Cervical Fractures-will need aspen collar until seen again by NS HLD-cont atorvastatin 40, zetia 10 GERD protonix 40 bid    DVT prophylaxis: scd Code Status: DNR Family Communication: no family + Disposition Plan: unclear   Consultants:   ns  Procedures:   mri  Antimicrobials:   none    Subjective: Fair Pain fair control Has questions about her eye and vision No n/v Feels tremulous   Objective: Vitals:   09/14/16 2033 09/15/16 0605 09/15/16 1210 09/15/16 1319  BP: (!) 147/58 (!) 120/57 138/66 (!) 144/54  Pulse: (!) 55 (!) 55  (!) 58  Resp: '15 15  15  '$ Temp: 97.7 F (36.5 C) 97.9 F (36.6 C)  97.7 F (36.5 C)  TempSrc: Oral   Oral  SpO2: 96% 97% 98% 97%  Weight: 89 kg (196 lb 3.4 oz)     Height: '5\' 7"'$  (1.702 m)       Intake/Output  Summary (Last 24 hours) at 09/15/16 1716 Last data filed at 09/15/16 1539  Gross per 24 hour  Intake              420 ml  Output              850 ml  Net             -430 ml   Filed Weights   09/14/16 0335 09/14/16 2033  Weight: 87 kg (191 lb 12.8 oz) 89 kg (196 lb 3.4 oz)    Examination:  General exam: Appears calm and comfortable , L eye hematoma.  Forehead hematoma noted Respiratory system: Clear to auscultation. Respiratory effort normal. Cardiovascular system: S1 & S2 heard, RRR. No JVD, murmurs, rubs, gallops or clicks. No pedal edema. Gastrointestinal system: Abdomen is nondistended, soft and nontender. No organomegaly or masses felt. Normal bowel sounds heard. Central nervous system: Alert and oriented. No focal neurological deficits. Extremities: Symmetric 5 x 5 power. Sensory intact, Knee jerk equivocal Skin: No rashes, lesions or ulcers Psychiatry: Judgement and insight appear normal. Mood & affect appropriate.     Data Reviewed: I have personally reviewed following labs and imaging studies  CBC:  Recent Labs Lab 09/13/16 2130 09/14/16 0421 09/15/16 0305  WBC 13.8* 8.8 9.8  NEUTROABS 11.9*  --   --   HGB 13.9 12.7 12.3  HCT 42.5 39.6 37.3  MCV 83.0 83.7 81.4  PLT 218 187 790   Basic Metabolic Panel:  Recent Labs Lab 09/13/16 2130 09/14/16 0421  NA 136 134*  K 3.6 3.4*  CL 97* 95*  CO2 31 28  GLUCOSE 180* 164*  BUN 24* 17  CREATININE 0.81 0.67  CALCIUM 8.9 8.4*   GFR: Estimated Creatinine Clearance: 60 mL/min (by C-G formula based on SCr of 0.67 mg/dL). Liver Function Tests:  Recent Labs Lab 09/13/16 2130  AST 28  ALT 11*  ALKPHOS 97  BILITOT 0.6  PROT 6.0*  ALBUMIN 3.4*   No results for input(s): LIPASE, AMYLASE in the last 168 hours. No results for input(s): AMMONIA in the last 168 hours. Coagulation Profile: No results for input(s): INR, PROTIME in the last 168 hours. Cardiac Enzymes:  Recent Labs Lab 09/13/16 2130  TROPONINI  <0.03   BNP (last 3 results) No results for input(s): PROBNP in the last 8760 hours. HbA1C: No results for input(s): HGBA1C in the last 72 hours. CBG: No results for input(s): GLUCAP in the last 168 hours. Lipid Profile: No results for input(s): CHOL, HDL, LDLCALC, TRIG, CHOLHDL, LDLDIRECT in the last 72 hours. Thyroid Function Tests: No results for input(s): TSH, T4TOTAL, FREET4, T3FREE, THYROIDAB in the last 72 hours. Anemia Panel: No results for input(s): VITAMINB12, FOLATE, FERRITIN, TIBC, IRON, RETICCTPCT in the last 72 hours. Sepsis Labs: No results for input(s): PROCALCITON, LATICACIDVEN in the last 168 hours.  Recent Results (from the past 240 hour(s))  MRSA PCR Screening     Status: None   Collection Time: 09/14/16  3:34 AM  Result Value Ref Range Status   MRSA by PCR NEGATIVE NEGATIVE Final    Comment:        The GeneXpert MRSA Assay (FDA approved for NASAL specimens only), is one component of a comprehensive MRSA colonization surveillance program. It is not intended to diagnose MRSA infection nor to guide or monitor treatment for MRSA infections.          Radiology Studies: Dg Chest 2 View  Result Date: 09/13/2016 CLINICAL DATA:  Initial evaluation for acute dizziness, fall. EXAM: CHEST  2 VIEW COMPARISON:  Prior radiograph from 01/01/2009. FINDINGS: Moderate cardiomegaly, slightly progressed relative to most recent radiograph from 2010. Mediastinal silhouette within normal limits. Aortic atherosclerosis, also slightly progressed. Lungs normally inflated. Mild diffuse interstitial prominence, suggesting mild interstitial edema. No focal infiltrates. No significant pleural effusion. No pneumothorax. No acute osseous abnormality. IMPRESSION: 1. Cardiomegaly with mild diffuse interstitial congestion/edema. Cardiomegaly has mildly progressed relative to most recent radiograph from 01/01/2009. 2. No other active cardiopulmonary disease. 3. Aortic atherosclerosis, also  progressed relative to 2010. Electronically Signed   By: Jeannine Boga M.D.   On: 09/13/2016 21:40   Dg Tibia/fibula Right  Result Date: 09/13/2016 CLINICAL DATA:  Initial evaluation for acute trauma, fall. EXAM: RIGHT TIBIA AND FIBULA - 2 VIEW COMPARISON:  None. FINDINGS: There is no evidence of fracture or other focal bone lesions. So no acute soft tissue abnormality about the leg scattered atheromatous vascular calcifications noted posterior to the knee. Advanced degenerative changes noted about the partially visualized knee. IMPRESSION: 1. No acute osseous abnormality about the right tibia/fibula. 2. Advanced degenerative osteoarthrosis about the right knee. 3. Atherosclerotic disease. Electronically Signed   By: Jeannine Boga M.D.   On: 09/13/2016 21:42   Ct Head Wo Contrast  Result Date: 09/13/2016 CLINICAL DATA:  82 year old female with dizziness and fall. History of breast cancer.  EXAM: CT HEAD WITHOUT CONTRAST CT MAXILLOFACIAL WITHOUT CONTRAST CT CERVICAL SPINE WITHOUT CONTRAST TECHNIQUE: Multidetector CT imaging of the head, cervical spine, and maxillofacial structures were performed using the standard protocol without intravenous contrast. Multiplanar CT image reconstructions of the cervical spine and maxillofacial structures were also generated. COMPARISON:  Head CT dated 02/24/2009 FINDINGS: CT HEAD FINDINGS Brain: There is a left frontotemporal subdural hemorrhage measuring up to 4 mm in thickness. Right anterior parafalcine subdural hemorrhage measures 4 mm in thickness. An area of white matter edema noted over the left frontal convexity compatible with vasogenic edema. There is mild mass effect and effacement of the adjacent sulci. A 1.2 x 0.9 cm density in the left frontal convexity (series 2, image 23 and sagittal series 5 image 41) is concerning for a primary neoplasm versus metastatic disease. Further evaluation with MRI without and with contrast is recommended. There is  mild age-related atrophy and chronic microvascular ischemic changes. No midline shift noted. The quadrigeminal plate cistern is patent. Vascular: No hyperdense vessel or unexpected calcification. Skull: Nondisplaced fracture of the right occipital condyle. No other calcaneal fracture. Other: Large left forehead hematoma. CT MAXILLOFACIAL FINDINGS Osseous: No fracture or mandibular dislocation. No destructive process. Chronic degenerative changes at the left TMJ. Orbits: Bilateral cataract surgeries noted. The globes and retro-orbital fat are preserved. Sinuses: Clear. Soft tissues: Left forehead hematoma. CT CERVICAL SPINE FINDINGS Alignment: No acute subluxation. Skull base and vertebrae: There is nondisplaced comminuted appearing fracture of the right occipital condyle. There is nondisplaced fracture of the right posterior arch of C1. There is incomplete bony fusion of the posterior ring of C1. There is type III fracture of the base of the odontoid process with approximately 2 mm posterior displacement of the tip of the odontoid. Evaluation for fracture is very limited due to advanced osteopenia. There is apparent ankylosis of the C2-C5 posterior elements related to chronic changes and inflammation. Soft tissues and spinal canal: No prevertebral fluid or swelling. No visible canal hematoma. Disc levels:  Multilevel degenerative changes. Upper chest: Bilateral carotid bulb atherosclerotic disease. Other: None IMPRESSION: Left frontal white matter vasogenic edema with suspected underlying mass/metastatic disease. MRI without and with contrast is recommended for further evaluation. Small left frontotemporal subdural hemorrhage as well as anterior parafalcine subdural hemorrhage. No midline shift. Nondisplaced comminuted appearing fracture of the right occipital condyle. Nondisplaced fracture of the right posterior arch of C1. Type III fracture of the odontoid process with 2 mm posterior displacement. These results  were called by telephone at the time of interpretation on 09/13/2016 at 9:42 pm to Dr. Davonna Belling , who verbally acknowledged these results. Electronically Signed   By: Anner Crete M.D.   On: 09/13/2016 21:48   Ct Cervical Spine Wo Contrast  Result Date: 09/13/2016 CLINICAL DATA:  82 year old female with dizziness and fall. History of breast cancer. EXAM: CT HEAD WITHOUT CONTRAST CT MAXILLOFACIAL WITHOUT CONTRAST CT CERVICAL SPINE WITHOUT CONTRAST TECHNIQUE: Multidetector CT imaging of the head, cervical spine, and maxillofacial structures were performed using the standard protocol without intravenous contrast. Multiplanar CT image reconstructions of the cervical spine and maxillofacial structures were also generated. COMPARISON:  Head CT dated 02/24/2009 FINDINGS: CT HEAD FINDINGS Brain: There is a left frontotemporal subdural hemorrhage measuring up to 4 mm in thickness. Right anterior parafalcine subdural hemorrhage measures 4 mm in thickness. An area of white matter edema noted over the left frontal convexity compatible with vasogenic edema. There is mild mass effect and effacement of the adjacent sulci.  A 1.2 x 0.9 cm density in the left frontal convexity (series 2, image 23 and sagittal series 5 image 41) is concerning for a primary neoplasm versus metastatic disease. Further evaluation with MRI without and with contrast is recommended. There is mild age-related atrophy and chronic microvascular ischemic changes. No midline shift noted. The quadrigeminal plate cistern is patent. Vascular: No hyperdense vessel or unexpected calcification. Skull: Nondisplaced fracture of the right occipital condyle. No other calcaneal fracture. Other: Large left forehead hematoma. CT MAXILLOFACIAL FINDINGS Osseous: No fracture or mandibular dislocation. No destructive process. Chronic degenerative changes at the left TMJ. Orbits: Bilateral cataract surgeries noted. The globes and retro-orbital fat are preserved.  Sinuses: Clear. Soft tissues: Left forehead hematoma. CT CERVICAL SPINE FINDINGS Alignment: No acute subluxation. Skull base and vertebrae: There is nondisplaced comminuted appearing fracture of the right occipital condyle. There is nondisplaced fracture of the right posterior arch of C1. There is incomplete bony fusion of the posterior ring of C1. There is type III fracture of the base of the odontoid process with approximately 2 mm posterior displacement of the tip of the odontoid. Evaluation for fracture is very limited due to advanced osteopenia. There is apparent ankylosis of the C2-C5 posterior elements related to chronic changes and inflammation. Soft tissues and spinal canal: No prevertebral fluid or swelling. No visible canal hematoma. Disc levels:  Multilevel degenerative changes. Upper chest: Bilateral carotid bulb atherosclerotic disease. Other: None IMPRESSION: Left frontal white matter vasogenic edema with suspected underlying mass/metastatic disease. MRI without and with contrast is recommended for further evaluation. Small left frontotemporal subdural hemorrhage as well as anterior parafalcine subdural hemorrhage. No midline shift. Nondisplaced comminuted appearing fracture of the right occipital condyle. Nondisplaced fracture of the right posterior arch of C1. Type III fracture of the odontoid process with 2 mm posterior displacement. These results were called by telephone at the time of interpretation on 09/13/2016 at 9:42 pm to Dr. Davonna Belling , who verbally acknowledged these results. Electronically Signed   By: Anner Crete M.D.   On: 09/13/2016 21:48   Mr Jeri Cos BT Contrast  Result Date: 09/14/2016 CLINICAL DATA:  Initial evaluation for brain mass. EXAM: MRI HEAD WITHOUT AND WITH CONTRAST TECHNIQUE: Multiplanar, multiecho pulse sequences of the brain and surrounding structures were obtained without and with intravenous contrast. CONTRAST:  41m MULTIHANCE GADOBENATE DIMEGLUMINE  529 MG/ML IV SOLN COMPARISON:  Prior CT from 09/13/2016. FINDINGS: Brain: Study degraded by motion artifact. Cerebral volume within normal limits for age. Patchy T2/FLAIR hyperintensity within the periventricular and deep white matter both cerebral hemispheres most consistent with chronic small vessel ischemic disease, mild in nature. There is a heterogeneous enhancing mass centered at the left frontal parietal convexity that measures 21 x 22 x 16 mm. Associated vasogenic edema within the left frontotemporal region without significant mass effect. No other mass lesion or abnormal enhancement identified. Differential considerations include possible primary CNS neoplasm or solitary metastasis. Diffusion-weighted imaging demonstrates no evidence for acute ischemic infarct. Acute subdural hematoma overlying the left frontotemporal convexity again noted. This measures up to 5 mm in maximal thickness. No midline shift. No hydrocephalus. Incidental note made of a partially empty sella. Midline structures intact. Vascular: Major intracranial vascular flow voids are preserved. Skull and upper cervical spine: Acute odontoid fracture noted, better evaluated on prior CT. Right occipital condyle fracture also better visualized on CT. Craniocervical junction remains widely patent. Visualized upper cervical spine within normal limits without acute injury. Large hematoma at the left frontal scalp  with associated swelling. Sinuses/Orbits: Globes intact. No definite retro-orbital pathology. Mild left-sided proptosis. Patient is status post lens extraction. Paranasal sinuses are clear. No mastoid effusion. Inner ear structures normal. IMPRESSION: 1. 21 x 22 x 16 mm heterogeneous Lee enhancing mass at the left frontal parietal convexity with associated vasogenic edema. No other intracranial mass lesion identified. Finding may reflect a solitary intracranial metastasis or primary CNS neoplasm. 2. Acute left frontotemporal subdural  hematoma measuring up to 5 mm without significant mass effect. No midline shift. 3. Acute odontoid and right occipital condyle fractures, better evaluated on prior CT. Craniocervical juncture remains widely patent, with no MRI evidence for acute injury to the visualized upper cervical spinal cord. 4. Large hematoma at the left frontal scalp. Electronically Signed   By: Jeannine Boga M.D.   On: 09/14/2016 03:55   Ct Maxillofacial Wo Contrast  Result Date: 09/13/2016 CLINICAL DATA:  82 year old female with dizziness and fall. History of breast cancer. EXAM: CT HEAD WITHOUT CONTRAST CT MAXILLOFACIAL WITHOUT CONTRAST CT CERVICAL SPINE WITHOUT CONTRAST TECHNIQUE: Multidetector CT imaging of the head, cervical spine, and maxillofacial structures were performed using the standard protocol without intravenous contrast. Multiplanar CT image reconstructions of the cervical spine and maxillofacial structures were also generated. COMPARISON:  Head CT dated 02/24/2009 FINDINGS: CT HEAD FINDINGS Brain: There is a left frontotemporal subdural hemorrhage measuring up to 4 mm in thickness. Right anterior parafalcine subdural hemorrhage measures 4 mm in thickness. An area of white matter edema noted over the left frontal convexity compatible with vasogenic edema. There is mild mass effect and effacement of the adjacent sulci. A 1.2 x 0.9 cm density in the left frontal convexity (series 2, image 23 and sagittal series 5 image 41) is concerning for a primary neoplasm versus metastatic disease. Further evaluation with MRI without and with contrast is recommended. There is mild age-related atrophy and chronic microvascular ischemic changes. No midline shift noted. The quadrigeminal plate cistern is patent. Vascular: No hyperdense vessel or unexpected calcification. Skull: Nondisplaced fracture of the right occipital condyle. No other calcaneal fracture. Other: Large left forehead hematoma. CT MAXILLOFACIAL FINDINGS Osseous:  No fracture or mandibular dislocation. No destructive process. Chronic degenerative changes at the left TMJ. Orbits: Bilateral cataract surgeries noted. The globes and retro-orbital fat are preserved. Sinuses: Clear. Soft tissues: Left forehead hematoma. CT CERVICAL SPINE FINDINGS Alignment: No acute subluxation. Skull base and vertebrae: There is nondisplaced comminuted appearing fracture of the right occipital condyle. There is nondisplaced fracture of the right posterior arch of C1. There is incomplete bony fusion of the posterior ring of C1. There is type III fracture of the base of the odontoid process with approximately 2 mm posterior displacement of the tip of the odontoid. Evaluation for fracture is very limited due to advanced osteopenia. There is apparent ankylosis of the C2-C5 posterior elements related to chronic changes and inflammation. Soft tissues and spinal canal: No prevertebral fluid or swelling. No visible canal hematoma. Disc levels:  Multilevel degenerative changes. Upper chest: Bilateral carotid bulb atherosclerotic disease. Other: None IMPRESSION: Left frontal white matter vasogenic edema with suspected underlying mass/metastatic disease. MRI without and with contrast is recommended for further evaluation. Small left frontotemporal subdural hemorrhage as well as anterior parafalcine subdural hemorrhage. No midline shift. Nondisplaced comminuted appearing fracture of the right occipital condyle. Nondisplaced fracture of the right posterior arch of C1. Type III fracture of the odontoid process with 2 mm posterior displacement. These results were called by telephone at the time of interpretation  on 09/13/2016 at 9:42 pm to Dr. Davonna Belling , who verbally acknowledged these results. Electronically Signed   By: Anner Crete M.D.   On: 09/13/2016 21:48        Scheduled Meds: . atorvastatin  40 mg Oral q1800  . dexamethasone  4 mg Intravenous Q6H  . diltiazem  240 mg Oral Daily  .  ezetimibe  10 mg Oral QHS  . hydrochlorothiazide  12.5 mg Oral Daily  . levETIRAcetam  500 mg Oral BID  . metoprolol succinate  50 mg Oral Daily  . pantoprazole  40 mg Oral BID  . polyvinyl alcohol  1 drop Both Eyes Daily   Continuous Infusions:   LOS: 1 day    Time spent: 15    Nita Sells, MD [facility] Pager [phone]  If 7PM-7AM, please contact night-coverage [email or web address] Password [facility] 09/15/2016, 5:16 PM

## 2016-09-15 NOTE — Progress Notes (Signed)
MRI stated that per radiologic its okey to scan tonight. Ativan given.

## 2016-09-15 NOTE — Clinical Social Work Placement (Signed)
   CLINICAL SOCIAL WORK PLACEMENT  NOTE  Date:  09/15/2016  Patient Details  Name: [name] MRN: [id number] Date of Birth: November 06, 1929  Clinical Social Work is seeking post-discharge placement for this patient at the [place] level of care (*CSW will initial, date and re-position this form in  chart as items are completed):  Yes   Patient/family provided with [facility] Work Department's list of facilities offering this level of care within the geographic area requested by the patient (or if unable, by the patient's family).  Yes   Patient/family informed of their freedom to choose among providers that offer the needed level of care, that participate in Medicare, Medicaid or managed care program needed by the patient, have an available bed and are willing to accept the patient.  Yes   Patient/family informed of [facility]'s ownership interest in [facility] and [facility], as well as of the fact that they are under no obligation to receive care at these facilities.  PASRR submitted to EDS on 09/15/16     PASRR number received on 09/15/16     Existing PASRR number confirmed on       FL2 transmitted to all facilities in geographic area requested by pt/family on 09/15/16     FL2 transmitted to all facilities within larger geographic area on       Patient informed that his/her managed care company has contracts with or will negotiate with certain facilities, including the following:        Yes   Patient/family informed of bed offers received.  Patient chooses bed at [facility]     Physician recommends and patient chooses bed at      Patient to be transferred to   on  .  Patient to be transferred to facility by       Patient family notified on   of transfer.  Name of family member notified:        PHYSICIAN Please sign FL2, Please sign DNR     Additional Comment:     _______________________________________________ Lilly [place], LCSW 09/15/2016, 3:21 PM

## 2016-09-15 NOTE — NC FL2 (Signed)
[place] LEVEL OF CARE SCREENING TOOL     IDENTIFICATION  Patient Name: [name] Birthdate: 12-24-1929 Sex: female Admission Date (Current Location): 09/13/2016  [facility] and [place] Number:  Herbalist and Address:  The . [facility], [place] [address] [place], [place] [address]      Provider Number: [id number]  Attending Physician Name and Address:  Nita Sells, MD  Relative Name and Phone Number:       Current Level of Care: Hospital Recommended Level of Care: [place] Prior Approval Number:    Date Approved/Denied:   PASRR Number:   [id number]   Discharge Plan: SNF    Current Diagnoses: Patient Active Problem List   Diagnosis Date Noted  . Subdural hematoma ([place]) 09/14/2016  . A-fib ([place]) 09/14/2016  . Coronary artery disease 09/14/2016  . Hypertension 09/14/2016  . MI (myocardial infarction) 09/14/2016  . Uterine cancer ([place]) 09/14/2016  . Intracranial mass 09/14/2016    Orientation RESPIRATION BLADDER Height & Weight     Self, Place, Situation  Normal Continent Weight: 196 lb 3.4 oz (89 kg) Height:  '5\' 7"'$  (170.2 cm)  BEHAVIORAL SYMPTOMS/MOOD NEUROLOGICAL BOWEL NUTRITION STATUS      Continent Diet (regular)  AMBULATORY STATUS COMMUNICATION OF NEEDS Skin   Extensive Assist   PU Stage and Appropriate Care, Skin abrasions (Hematomoa of the head and eye)                       Personal Care Assistance Level of Assistance  Bathing, Feeding, Dressing Bathing Assistance: Limited assistance Feeding assistance: Independent Dressing Assistance: Limited assistance     Functional Limitations Info  Sight, Hearing, Speech Sight Info: Adequate Hearing Info: Adequate Speech Info: Adequate    SPECIAL CARE FACTORS FREQUENCY  PT (By licensed PT), OT (By licensed OT)     PT Frequency: 5x a week OT Frequency: 5x a week            Contractures Contractures Info: Not present     Additional Factors Info  Code Status, Allergies Code Status Info: DNR Allergies Info:  Codeine, Dabigatran, Iodine, Penicillins, Sulfa Antibiotics           Current Medications (09/15/2016):  This is the current hospital active medication list Current Facility-Administered Medications  Medication Dose Route Frequency Provider Last Rate Last Dose  . acetaminophen (TYLENOL) tablet 325-650 mg  325-650 mg Oral Q6H PRN Norval Morton, MD      . albuterol (PROVENTIL) (2.5 MG/3ML) 0.083% nebulizer solution 2.5 mg  2.5 mg Nebulization Q4H PRN Norval Morton, MD      . atorvastatin (LIPITOR) tablet 40 mg  40 mg Oral q1800 Norval Morton, MD   40 mg at 09/14/16 1757  . dexamethasone (DECADRON) injection 4 mg  4 mg Intravenous Q6H Rondell Charmayne Sheer, MD   4 mg at 09/15/16 1222  . diltiazem (CARDIZEM CD) 24 hr capsule 240 mg  240 mg Oral Daily Norval Morton, MD   240 mg at 09/15/16 0941  . ezetimibe (ZETIA) tablet 10 mg  10 mg Oral QHS Norval Morton, MD   10 mg at 09/14/16 2109  . hydrochlorothiazide (HYDRODIURIL) tablet 12.5 mg  12.5 mg Oral Daily Norval Morton, MD   12.5 mg at 09/15/16 0941  . HYDROmorphone (DILAUDID) injection 1 mg  1 mg Intravenous Q3H PRN Nita Sells, MD   1 mg at 09/14/16 2030  . ipratropium (ATROVENT)  nebulizer solution 0.5 mg  0.5 mg Nebulization Q4H PRN Norval Morton, MD      . levETIRAcetam (KEPPRA) tablet 500 mg  500 mg Oral BID Kevan Ny Ditty, MD   500 mg at 09/15/16 0941  . metoprolol succinate (TOPROL-XL) 24 hr tablet 50 mg  50 mg Oral Daily Norval Morton, MD   50 mg at 09/15/16 0940  . ondansetron (ZOFRAN) tablet 4 mg  4 mg Oral Q6H PRN Norval Morton, MD       Or  . ondansetron (ZOFRAN) injection 4 mg  4 mg Intravenous Q6H PRN Norval Morton, MD      . oxyCODONE-acetaminophen (PERCOCET/ROXICET) 5-325 MG per tablet 2 tablet  2 tablet Oral Q4H PRN Nita Sells, MD   2 tablet at 09/15/16 0850  . pantoprazole (PROTONIX) EC tablet 40 mg   40 mg Oral BID Norval Morton, MD   40 mg at 09/15/16 0941  . polyvinyl alcohol (LIQUIFILM TEARS) 1.4 % ophthalmic solution 1 drop  1 drop Both Eyes Daily Norval Morton, MD   1 drop at 09/15/16 0941     Discharge Medications: Please see discharge summary for a list of discharge medications.  Relevant Imaging Results:  Relevant Lab Results:   Additional Information SSN: [id number]  [place], [place]

## 2016-09-16 ENCOUNTER — Inpatient Hospital Stay (HOSPITAL_COMMUNITY): Payer: Medicare HMO

## 2016-09-16 ENCOUNTER — Encounter (HOSPITAL_COMMUNITY): Payer: Self-pay | Admitting: *Deleted

## 2016-09-16 LAB — COMPREHENSIVE METABOLIC PANEL
ALBUMIN: 3 g/dL — AB (ref 3.5–5.0)
ALK PHOS: 87 U/L (ref 38–126)
ALT: 11 U/L — ABNORMAL LOW (ref 14–54)
AST: 23 U/L (ref 15–41)
Anion gap: 10 (ref 5–15)
BILIRUBIN TOTAL: 0.8 mg/dL (ref 0.3–1.2)
BUN: 30 mg/dL — AB (ref 6–20)
CO2: 30 mmol/L (ref 22–32)
Calcium: 9.1 mg/dL (ref 8.9–10.3)
Chloride: 91 mmol/L — ABNORMAL LOW (ref 101–111)
Creatinine, Ser: 0.94 mg/dL (ref 0.44–1.00)
GFR calc Af Amer: >60 mL/min (ref >60)
GFR calc non Af Amer: 54 mL/min — ABNORMAL LOW (ref >60)
GLUCOSE: 156 mg/dL — AB (ref 65–99)
POTASSIUM: 4.2 mmol/L (ref 3.5–5.1)
SODIUM: 131 mmol/L — AB (ref 135–145)
TOTAL PROTEIN: 5.6 g/dL — AB (ref 6.5–8.1)

## 2016-09-16 LAB — CBC
HEMATOCRIT: 40.9 % (ref 36.0–46.0)
Hemoglobin: 13.6 g/dL (ref 12.0–15.0)
MCH: 27 pg (ref 26.0–34.0)
MCHC: 33.3 g/dL (ref 30.0–36.0)
MCV: 81.3 fL (ref 78.0–100.0)
Platelets: 212 10*3/uL (ref 150–400)
RBC: 5.03 MIL/uL (ref 3.87–5.11)
RDW: 14.9 % (ref 11.5–15.5)
WBC: 10.6 10*3/uL — ABNORMAL HIGH (ref 4.0–10.5)

## 2016-09-16 MED ORDER — TRAMADOL HCL 50 MG PO TABS: 100.0000 mg | ORAL_TABLET | Freq: Four times a day (QID) | ORAL | Status: DC

## 2016-09-16 MED ORDER — IOPAMIDOL (ISOVUE-300) INJECTION 61%
INTRAVENOUS | Status: AC
  Administered 2016-09-16: 100 mL
  Filled 2016-09-16: qty 100

## 2016-09-16 MED ORDER — DEXAMETHASONE 2 MG PO TABS
2.0000 mg | ORAL_TABLET | Freq: Three times a day (TID) | ORAL | Status: DC
  Administered 2016-09-16 – 2016-09-18 (×6): 2 mg via ORAL
  Filled 2016-09-16 (×7): qty 1

## 2016-09-16 NOTE — Progress Notes (Signed)
Thank you for getting brainlab MRI Probably also worth getting CT chest/abdomen/pelvis to rule out systemic disease, even though this is almost certainly a glioma

## 2016-09-16 NOTE — Progress Notes (Addendum)
PROGRESS NOTE    Delores Edelstein  FVC:[id number] DOB: Feb 12, 1930 DOA: 09/13/2016 PCP: Yong Channel, MD    Brief Narrative:  82 y/o ? Known h/o essential tremor Urge incontinence Known L Carotid bruit Hematochezia 11/2015 with prior adenoma-missed a colonoscopy Prior MI with RCA stent CHr A flutter on xarelto hld  gerd  Admitted with fall and significant hematomas to head and also found to have potential brain mass Placed on decadron as well as keppra Ns consulted  Found to have 5x3x3 chest mass on chest CT  Assessment & Plan:   Principal Problem:   Subdural hematoma ([place]) Active Problems:   A-fib ([place])   Coronary artery disease   Hypertension   MI (myocardial infarction)   Uterine cancer ([place])   Intracranial mass   SDH with Mass-appreciate NS input.  started keppra 500 bid.  Cont decadron 4 mg iv q6--transitioned to decadron 2 mg q8. start soft diet.  Will need SNF per compelte PT eval and SW aware. Lung mass-Ct chest shows 5x3x3 mass.  D/w Dr Nelda Marseille who recommends IR biopsy due to location-asked Oncology Dr. Lindi Adie for opinion and he will arrange Chest Navigator to follow and schedule follow-ups and PET etc if so desired by patient Prior Uterine [place] with XRT in [place] 8/10-start percocet 10 q4 prn, Dilaudid for severe pain-pain is realtively better controlled Aflutter/fib CHad2Vasc2 score >4-hold xarelto indef for now.--NS to determine best time to resume.  Cont metorpolol 50 qd, Cardizem 240 qd Htn cont hctz 12.5-relatively well controlled on current dose--mild volume depletion with hyponatremia, so hold HCTZ 09/16/16 Cervical Fractures-will need aspen collar until seen again by NS HLD-cont atorvastatin 40, zetia 10 GERD protonix 40 bid    DVT prophylaxis: scd Code Status: DNR Family Communication: no family +.  discussed with cousin in detail Disposition Plan: likely can d/c if stable post lung biopsy under IR in 24-48 hr   Consultants:    ns  Procedures:   mri  Antimicrobials:   none    Subjective:  Mod controlled pain Sitting OOB No fever nor chills No cp   Objective: Vitals:   09/15/16 1319 09/15/16 2012 09/16/16 0605 09/16/16 1339  BP: (!) 144/54 (!) 158/59 (!) 121/42 131/75  Pulse: (!) 58 64 88 (!) 57  Resp: '15 16 16 16  '$ Temp: 97.7 F (36.5 C) 97.5 F (36.4 C) 97.5 F (36.4 C) 97.6 F (36.4 C)  TempSrc: Oral Oral  Oral  SpO2: 97% 95% 100% 97%  Weight:      Height:        Intake/Output Summary (Last 24 hours) at 09/16/16 1624 Last data filed at 09/16/16 1511  Gross per 24 hour  Intake              480 ml  Output             1700 ml  Net            -1220 ml   Filed Weights   09/14/16 0335 09/14/16 2033  Weight: 87 kg (191 lb 12.8 oz) 89 kg (196 lb 3.4 oz)    Examination:  General exam: Appears calm and comfortable , L eye hematoma.  Forehead hematoma noted Respiratory system: Clear to auscultation. Respiratory effort normal. Cardiovascular system: S1 & S2 heard, RRR. No JVD, murmurs, rubs, gallops or clicks. No pedal edema. Gastrointestinal system: Abdomen is nondistended, soft and nontender. No organomegaly or masses felt. Normal bowel sounds heard. Central nervous system: Alert and oriented.  No focal neurological deficits. Moves limbs x 4 equally Extremities: Symmetric 5 x 5 power. Sensory intact, Knee jerk equivocal Skin: No rashes, lesions or ulcers Psychiatry: Judgement and insight appear normal. Mood & affect appropriate.     Data Reviewed: I have personally reviewed following labs and imaging studies  CBC:  Recent Labs Lab 09/13/16 2130 09/14/16 0421 09/15/16 0305 09/16/16 0853  WBC 13.8* 8.8 9.8 10.6*  NEUTROABS 11.9*  --   --   --   HGB 13.9 12.7 12.3 13.6  HCT 42.5 39.6 37.3 40.9  MCV 83.0 83.7 81.4 81.3  PLT 218 187 218 035   Basic Metabolic Panel:  Recent Labs Lab 09/13/16 2130 09/14/16 0421 09/16/16 0853  NA 136 134* 131*  K 3.6 3.4* 4.2  CL 97*  95* 91*  CO2 '31 28 30  '$ GLUCOSE 180* 164* 156*  BUN 24* 17 30*  CREATININE 0.81 0.67 0.94  CALCIUM 8.9 8.4* 9.1   GFR: Estimated Creatinine Clearance: 51.1 mL/min (by C-G formula based on SCr of 0.94 mg/dL). Liver Function Tests:  Recent Labs Lab 09/13/16 2130 09/16/16 0853  AST 28 23  ALT 11* 11*  ALKPHOS 97 87  BILITOT 0.6 0.8  PROT 6.0* 5.6*  ALBUMIN 3.4* 3.0*   No results for input(s): LIPASE, AMYLASE in the last 168 hours. No results for input(s): AMMONIA in the last 168 hours. Coagulation Profile: No results for input(s): INR, PROTIME in the last 168 hours. Cardiac Enzymes:  Recent Labs Lab 09/13/16 2130  TROPONINI <0.03   BNP (last 3 results) No results for input(s): PROBNP in the last 8760 hours. HbA1C: No results for input(s): HGBA1C in the last 72 hours. CBG: No results for input(s): GLUCAP in the last 168 hours. Lipid Profile: No results for input(s): CHOL, HDL, LDLCALC, TRIG, CHOLHDL, LDLDIRECT in the last 72 hours. Thyroid Function Tests: No results for input(s): TSH, T4TOTAL, FREET4, T3FREE, THYROIDAB in the last 72 hours. Anemia Panel: No results for input(s): VITAMINB12, FOLATE, FERRITIN, TIBC, IRON, RETICCTPCT in the last 72 hours. Sepsis Labs: No results for input(s): PROCALCITON, LATICACIDVEN in the last 168 hours.  Recent Results (from the past 240 hour(s))  MRSA PCR Screening     Status: None   Collection Time: 09/14/16  3:34 AM  Result Value Ref Range Status   MRSA by PCR NEGATIVE NEGATIVE Final    Comment:        The GeneXpert MRSA Assay (FDA approved for NASAL specimens only), is one component of a comprehensive MRSA colonization surveillance program. It is not intended to diagnose MRSA infection nor to guide or monitor treatment for MRSA infections.          Radiology Studies: Ct Chest W Contrast  Result Date: 09/16/2016 CLINICAL DATA:  82 year old female with history of brain tumor, with recent dizziness and history of  fall yesterday. Multiple cervical spine fractures. EXAM: CT CHEST, ABDOMEN, AND PELVIS WITH CONTRAST TECHNIQUE: Multidetector CT imaging of the chest, abdomen and pelvis was performed following the standard protocol during bolus administration of intravenous contrast. CONTRAST:  1 ISOVUE-300 IOPAMIDOL (ISOVUE-300) INJECTION 61% COMPARISON:  No priors. FINDINGS: CT CHEST FINDINGS Cardiovascular: Heart size is mildly enlarged. Trace amount of pericardial fluid and/or thickening adjacent to the left ventricle, unlikely to be of any hemodynamic significance at this time. Small amount of pericardial calcification overlying the right atrioventricular groove inferiorly (image 47 of series 2). There is aortic atherosclerosis, as well as atherosclerosis of the great vessels of the mediastinum and  the coronary arteries, including calcified atherosclerotic plaque in the left main, left anterior descending, left circumflex and right coronary arteries. Mediastinum/Nodes: Borderline enlarged 9 mm short axis right hilar lymph node (axial image 25 of series 2). No other mediastinal or left hilar lymphadenopathy is noted. Esophagus is unremarkable in appearance. No axillary lymphadenopathy. Lungs/Pleura: In the mid right lower lobe there is a 5.1 x 3.5 x 3.9 cm macrolobulated mass which makes contact with the overlying pleura (image 96 of series 3 and coronal image 109 of series 5). Most of the basal segmental bronchi to the right lower lobe appear occluded, either with soft tissue wall or retained secretions. Mild postobstructive changes are noted distal to the mass in the posterior basal aspect of the right lower lobe, but the right lower lobe is otherwise relatively well aerated. 6 mm nodule in the lateral segment of the right middle lobe (image 85 of series 3). A few other scattered 1-3 mm pulmonary nodules are noted throughout the lungs bilaterally, highly nonspecific. No acute consolidative airspace disease. No pleural  effusions. Musculoskeletal: No acute displaced fractures or aggressive appearing lytic or blastic lesions are noted in the visualized portions of the skeleton. CT ABDOMEN PELVIS FINDINGS Hepatobiliary: No cystic or solid hepatic lesions are noted. No intra or extrahepatic biliary ductal dilatation. There is amorphous high attenuation material lying dependently in the gallbladder, compatible with biliary sludge (although some of this may reflect vicarious excretion of gadolinium related to recent MRI examination). No findings to suggest an acute cholecystitis at this time. Pancreas: No pancreatic mass. No pancreatic ductal dilatation. No pancreatic or peripancreatic fluid or inflammatory changes. Spleen: In the medial aspect of the spleen there is a 1.8 cm hypervascular lesions which appears to normalize to the remainder of splenic parenchyma on delayed images, possibly indicative of a hemangioma. Adrenals/Urinary Tract: In the anterior aspect of the interpolar region of the left kidney there is a a 2.1 cm simple cyst. Multiple other subcentimeter low-attenuation lesions are noted in the kidneys bilaterally, too small to characterize, but favored to represent cysts. In addition, however, there are multiple intermediate to high attenuation lesions in both kidneys which are incompletely characterized on today's examination. Several of these demonstrate no appreciable change in attenuation between portal venous phase and delayed images, favored to represent proteinaceous/hemorrhagic cysts, but overall incompletely characterized without noncontrast images. The largest of these lesions measure up to 2.2 cm in the lower pole of the right kidney. No hydroureteronephrosis. Urinary bladder is normal in appearance. Stomach/Bowel: The stomach is nearly decompressed, but otherwise unremarkable in appearance. There is no pathologic dilatation of small bowel or colon. Several colonic diverticulae are noted, without definite  surrounding inflammatory changes to suggest an acute diverticulitis at this time. The appendix is not confidently identified and may be surgically absent. Regardless, there are no inflammatory changes noted adjacent to the cecum to suggest the presence of an acute appendicitis at this time. Vascular/Lymphatic: Aortic atherosclerosis, without aneurysm or dissection in the abdominal or pelvic vasculature. No lymphadenopathy is noted in the abdomen or pelvis. Reproductive: Status post hysterectomy. Ovaries are not confidently identified may be surgically absent or atrophic. Other: No significant volume of ascites.  No pneumoperitoneum. Musculoskeletal: No acute displaced fractures in the visualized portions of the skeleton. Chronic appearing compression fracture of L3 with approximately 60% loss of central vertebral body height. There are no aggressive appearing lytic or blastic lesions noted in the visualized portions of the skeleton. Well-circumscribed low-attenuation lesion in the subcutaneous fat  of the left lumbar region measuring 1.9 cm in diameter (image 64 of series 2) is nonspecific, but likely a sebaceous cyst. IMPRESSION: 1. No signs of significant acute traumatic injury to the chest, abdomen or pelvis. 2. 5.1 x 3.5 x 3.9 cm right lower lobe mass with borderline enlarged right hilar lymph node, highly concerning for primary bronchogenic neoplasm. Further evaluation with PET-CT and/or biopsy is recommended in the near future for diagnostic and staging purposes. 3. Multiple indeterminate renal lesions in the right kidney. Although these are favored to represent proteinaceous/hemorrhagic cysts, further evaluation with MRI of the abdomen with and without IV gadolinium could provide characterization if clinically appropriate. 4. Colonic diverticulosis without evidence of acute diverticulitis at this time. 5. Aortic atherosclerosis, in addition to left main and 3 vessel coronary artery disease. 6. Additional  incidental findings, as above. Electronically Signed   By: Vinnie Langton M.D.   On: 09/16/2016 15:35   Mr Jeri Cos WR Contrast  Result Date: 09/15/2016 CLINICAL DATA:  Preoperative planning for brain tumor resection. EXAM: MRI HEAD WITHOUT AND WITH CONTRAST TECHNIQUE: Multiplanar, multiecho pulse sequences of the brain and surrounding structures were obtained without and with intravenous contrast. CONTRAST:  93m MULTIHANCE GADOBENATE DIMEGLUMINE 529 MG/ML IV SOLN COMPARISON:  Brain MRI 09/14/2016 FINDINGS: Brain: Left convexity subdural hematoma has undergone redistribution, now primarily adjacent to left parietal lobe, measuring 5 mm. There is multifocal hyperintense T2-weighted signal within the periventricular white matter, most often seen in the setting of chronic microvascular ischemia. Centered within the left precentral gyrus is a peripherally contrast-enhancing mass that measures 1.9 x 1.8 cm. There is surrounding hyperintense T2 weighted signal that extends anteriorly within the left frontal white matter and posteriorly into the left postcentral gyrus. There is no hydrocephalus or midline shift. No age advanced or lobar predominant atrophy. Vascular: Major intracranial arterial and venous sinus flow voids are preserved. No evidence of chronic microhemorrhage or amyloid angiopathy. Skull and upper cervical spine: Known fractures of the odontoid and left occipital condyles are better characterized on recent CT. Large left frontal scalp subgaleal hematoma is again noted. Extensive lateral left scalp soft tissue swelling also present. Sinuses/Orbits: No fluid levels or advanced mucosal thickening. No mastoid effusion. Normal orbits. IMPRESSION: 1. Peripherally contrast-enhancing mass centered within the left precentral gyrus, measuring approximately 2 cm. Surrounding hyperintense T2 weighted signal may indicate vasogenic edema versus non enhancing tumor. 2. No other intracranial lesions. 3.  Redistribution of left subdural hematoma without midline shift or hydrocephalus. 4. Odontoid and occipital condyle fractures are better characterized on recent CT. Electronically Signed   By: KUlyses JarredM.D.   On: 09/15/2016 20:22   Ct Abdomen Pelvis W Contrast  Result Date: 09/16/2016 CLINICAL DATA:  82year old female with history of brain tumor, with recent dizziness and history of fall yesterday. Multiple cervical spine fractures. EXAM: CT CHEST, ABDOMEN, AND PELVIS WITH CONTRAST TECHNIQUE: Multidetector CT imaging of the chest, abdomen and pelvis was performed following the standard protocol during bolus administration of intravenous contrast. CONTRAST:  1 ISOVUE-300 IOPAMIDOL (ISOVUE-300) INJECTION 61% COMPARISON:  No priors. FINDINGS: CT CHEST FINDINGS Cardiovascular: Heart size is mildly enlarged. Trace amount of pericardial fluid and/or thickening adjacent to the left ventricle, unlikely to be of any hemodynamic significance at this time. Small amount of pericardial calcification overlying the right atrioventricular groove inferiorly (image 47 of series 2). There is aortic atherosclerosis, as well as atherosclerosis of the great vessels of the mediastinum and the coronary arteries, including calcified atherosclerotic plaque  in the left main, left anterior descending, left circumflex and right coronary arteries. Mediastinum/Nodes: Borderline enlarged 9 mm short axis right hilar lymph node (axial image 25 of series 2). No other mediastinal or left hilar lymphadenopathy is noted. Esophagus is unremarkable in appearance. No axillary lymphadenopathy. Lungs/Pleura: In the mid right lower lobe there is a 5.1 x 3.5 x 3.9 cm macrolobulated mass which makes contact with the overlying pleura (image 96 of series 3 and coronal image 109 of series 5). Most of the basal segmental bronchi to the right lower lobe appear occluded, either with soft tissue wall or retained secretions. Mild postobstructive changes are  noted distal to the mass in the posterior basal aspect of the right lower lobe, but the right lower lobe is otherwise relatively well aerated. 6 mm nodule in the lateral segment of the right middle lobe (image 85 of series 3). A few other scattered 1-3 mm pulmonary nodules are noted throughout the lungs bilaterally, highly nonspecific. No acute consolidative airspace disease. No pleural effusions. Musculoskeletal: No acute displaced fractures or aggressive appearing lytic or blastic lesions are noted in the visualized portions of the skeleton. CT ABDOMEN PELVIS FINDINGS Hepatobiliary: No cystic or solid hepatic lesions are noted. No intra or extrahepatic biliary ductal dilatation. There is amorphous high attenuation material lying dependently in the gallbladder, compatible with biliary sludge (although some of this may reflect vicarious excretion of gadolinium related to recent MRI examination). No findings to suggest an acute cholecystitis at this time. Pancreas: No pancreatic mass. No pancreatic ductal dilatation. No pancreatic or peripancreatic fluid or inflammatory changes. Spleen: In the medial aspect of the spleen there is a 1.8 cm hypervascular lesions which appears to normalize to the remainder of splenic parenchyma on delayed images, possibly indicative of a hemangioma. Adrenals/Urinary Tract: In the anterior aspect of the interpolar region of the left kidney there is a a 2.1 cm simple cyst. Multiple other subcentimeter low-attenuation lesions are noted in the kidneys bilaterally, too small to characterize, but favored to represent cysts. In addition, however, there are multiple intermediate to high attenuation lesions in both kidneys which are incompletely characterized on today's examination. Several of these demonstrate no appreciable change in attenuation between portal venous phase and delayed images, favored to represent proteinaceous/hemorrhagic cysts, but overall incompletely characterized without  noncontrast images. The largest of these lesions measure up to 2.2 cm in the lower pole of the right kidney. No hydroureteronephrosis. Urinary bladder is normal in appearance. Stomach/Bowel: The stomach is nearly decompressed, but otherwise unremarkable in appearance. There is no pathologic dilatation of small bowel or colon. Several colonic diverticulae are noted, without definite surrounding inflammatory changes to suggest an acute diverticulitis at this time. The appendix is not confidently identified and may be surgically absent. Regardless, there are no inflammatory changes noted adjacent to the cecum to suggest the presence of an acute appendicitis at this time. Vascular/Lymphatic: Aortic atherosclerosis, without aneurysm or dissection in the abdominal or pelvic vasculature. No lymphadenopathy is noted in the abdomen or pelvis. Reproductive: Status post hysterectomy. Ovaries are not confidently identified may be surgically absent or atrophic. Other: No significant volume of ascites.  No pneumoperitoneum. Musculoskeletal: No acute displaced fractures in the visualized portions of the skeleton. Chronic appearing compression fracture of L3 with approximately 60% loss of central vertebral body height. There are no aggressive appearing lytic or blastic lesions noted in the visualized portions of the skeleton. Well-circumscribed low-attenuation lesion in the subcutaneous fat of the left lumbar region measuring 1.9  cm in diameter (image 64 of series 2) is nonspecific, but likely a sebaceous cyst. IMPRESSION: 1. No signs of significant acute traumatic injury to the chest, abdomen or pelvis. 2. 5.1 x 3.5 x 3.9 cm right lower lobe mass with borderline enlarged right hilar lymph node, highly concerning for primary bronchogenic neoplasm. Further evaluation with PET-CT and/or biopsy is recommended in the near future for diagnostic and staging purposes. 3. Multiple indeterminate renal lesions in the right kidney. Although  these are favored to represent proteinaceous/hemorrhagic cysts, further evaluation with MRI of the abdomen with and without IV gadolinium could provide characterization if clinically appropriate. 4. Colonic diverticulosis without evidence of acute diverticulitis at this time. 5. Aortic atherosclerosis, in addition to left main and 3 vessel coronary artery disease. 6. Additional incidental findings, as above. Electronically Signed   By: Vinnie Langton M.D.   On: 09/16/2016 15:35        Scheduled Meds: . atorvastatin  40 mg Oral q1800  . dexamethasone  4 mg Intravenous Q6H  . diltiazem  240 mg Oral Daily  . ezetimibe  10 mg Oral QHS  . hydrochlorothiazide  12.5 mg Oral Daily  . levETIRAcetam  500 mg Oral BID  . metoprolol succinate  50 mg Oral Daily  . pantoprazole  40 mg Oral BID  . polyvinyl alcohol  1 drop Both Eyes Daily  . traMADol  100 mg Oral Q6H   Continuous Infusions:   LOS: 2 days    Time spent: 15    Nita Sells, MD [facility] Pager [phone]  If 7PM-7AM, please contact night-coverage [email or web address] Password [facility] 09/16/2016, 4:24 PM

## 2016-09-16 NOTE — Progress Notes (Signed)
Patient ID: [name], female   DOB: 05/22/1930, 82 y.o.   MRN: [id number] Request received for CT guided biopsy of RLL lung mass in pt. Latest imaging studies were reviewed by Dr. Vernard Gambles and he feels initial approach at biopsy of this area should be via bronchoscopy due to close proximity of mass to bronchus. If pathology non diagnostic via bronch can then consider CT guided needle biopsy. Please page Dr. Vernard Gambles at [phone] with any additional questions

## 2016-09-17 ENCOUNTER — Inpatient Hospital Stay (HOSPITAL_COMMUNITY): Payer: Medicare HMO

## 2016-09-17 ENCOUNTER — Encounter (HOSPITAL_COMMUNITY): Payer: Self-pay | Admitting: General Practice

## 2016-09-17 DIAGNOSIS — I482 Chronic atrial fibrillation: Secondary | ICD-10-CM

## 2016-09-17 DIAGNOSIS — I6200 Nontraumatic subdural hemorrhage, unspecified: Secondary | ICD-10-CM

## 2016-09-17 DIAGNOSIS — R900 Intracranial space-occupying lesion found on diagnostic imaging of central nervous system: Secondary | ICD-10-CM

## 2016-09-17 DIAGNOSIS — R918 Other nonspecific abnormal finding of lung field: Secondary | ICD-10-CM

## 2016-09-17 DIAGNOSIS — I10 Essential (primary) hypertension: Secondary | ICD-10-CM

## 2016-09-17 DIAGNOSIS — I25119 Atherosclerotic heart disease of native coronary artery with unspecified angina pectoris: Secondary | ICD-10-CM

## 2016-09-17 LAB — COMPREHENSIVE METABOLIC PANEL
ALT: 10 U/L — ABNORMAL LOW (ref 14–54)
AST: 20 U/L (ref 15–41)
Albumin: 3 g/dL — ABNORMAL LOW (ref 3.5–5.0)
Alkaline Phosphatase: 78 U/L (ref 38–126)
Anion gap: 10 (ref 5–15)
BILIRUBIN TOTAL: 0.9 mg/dL (ref 0.3–1.2)
BUN: 30 mg/dL — AB (ref 6–20)
CALCIUM: 8.8 mg/dL — AB (ref 8.9–10.3)
CO2: 29 mmol/L (ref 22–32)
CREATININE: 0.84 mg/dL (ref 0.44–1.00)
Chloride: 94 mmol/L — ABNORMAL LOW (ref 101–111)
GFR calc Af Amer: >60 mL/min (ref >60)
Glucose, Bld: 150 mg/dL — ABNORMAL HIGH (ref 65–99)
POTASSIUM: 4.2 mmol/L (ref 3.5–5.1)
Sodium: 133 mmol/L — ABNORMAL LOW (ref 135–145)
TOTAL PROTEIN: 5.5 g/dL — AB (ref 6.5–8.1)

## 2016-09-17 LAB — CBC
HCT: 40.7 % (ref 36.0–46.0)
Hemoglobin: 13.5 g/dL (ref 12.0–15.0)
MCH: 27 pg (ref 26.0–34.0)
MCHC: 33.2 g/dL (ref 30.0–36.0)
MCV: 81.4 fL (ref 78.0–100.0)
PLATELETS: 218 10*3/uL (ref 150–400)
RBC: 5 MIL/uL (ref 3.87–5.11)
RDW: 14.7 % (ref 11.5–15.5)
WBC: 9.4 10*3/uL (ref 4.0–10.5)

## 2016-09-17 MED ORDER — LIDOCAINE HCL (PF) 1 % IJ SOLN
INTRAMUSCULAR | Status: AC
  Filled 2016-09-17: qty 30

## 2016-09-17 MED ORDER — OXYCODONE-ACETAMINOPHEN 5-325 MG PO TABS: 2.0000 | ORAL_TABLET | ORAL | 0 refills | Status: DC | PRN

## 2016-09-17 MED ORDER — MIDAZOLAM HCL 2 MG/2ML IJ SOLN
INTRAMUSCULAR | Status: AC
  Filled 2016-09-17: qty 2

## 2016-09-17 MED ORDER — MIDAZOLAM HCL 2 MG/2ML IJ SOLN
INTRAMUSCULAR | Status: AC | PRN
  Administered 2016-09-17: 1 mg via INTRAVENOUS

## 2016-09-17 MED ORDER — FENTANYL CITRATE (PF) 100 MCG/2ML IJ SOLN
INTRAMUSCULAR | Status: AC
  Filled 2016-09-17: qty 2

## 2016-09-17 MED ORDER — SODIUM CHLORIDE 0.9 % IV BOLUS (SEPSIS): 500.0000 mL | Freq: Once | INTRAVENOUS | Status: DC

## 2016-09-17 MED ORDER — SODIUM CHLORIDE 0.9 % IV BOLUS (SEPSIS)
500.0000 mL | Freq: Once | INTRAVENOUS | Status: AC
  Administered 2016-09-17: 500 mL via INTRAVENOUS

## 2016-09-17 MED ORDER — FENTANYL CITRATE (PF) 100 MCG/2ML IJ SOLN
INTRAMUSCULAR | Status: AC | PRN
  Administered 2016-09-17: 50 ug via INTRAVENOUS

## 2016-09-17 NOTE — Sedation Documentation (Signed)
Vital signs stable. 

## 2016-09-17 NOTE — Progress Notes (Signed)
Physical Therapy Treatment Patient Details Name: [name] MRN: [id number] DOB: [birth year] Today's Date: 09/17/2016    History of Present Illness Pt admitted on 09/13/16 with dizziness and fall. She struck her head but did not lose consciousness. She has had some tingling in her R arm as well as a tremor. MRI showed Lt SDH and Lt posterior enhancing mass.  PTA, pt independent and lives at [facility].              PT Comments    Pt able to tolerate short distance ambulation (5 ft) today but c/o lightheadedness throughout. BP at 161/86. Pt able to transfer sit<>stand with min assist but requires chair follow as pt quickly fatigues and needs seated rest break. Pt denied continuing ambulation once sitting. Pt continues to be a good candidate for SNF following hospital stay to address deficits in activity tolerance and mobility before return to home. PT will continue to follow acutely.    Follow Up Recommendations  SNF     Equipment Recommendations  Rolling walker with 5" wheels    Recommendations for Other Services       Precautions / Restrictions Precautions Precautions: Fall Required Braces or Orthoses: Other Brace/Splint Other Brace/Splint: [place] collar Restrictions Weight Bearing Restrictions: No    Mobility  Bed Mobility               General bed mobility comments: pt in chair on OT arrival  Transfers Overall transfer level: Needs assistance Equipment used: Rolling walker (2 wheeled) Transfers: Sit to/from Stand Sit to Stand: Min assist            Ambulation/Gait Ambulation/Gait assistance: Min guard Ambulation Distance (Feet): 5 Feet Assistive device: Rolling walker (2 wheeled) Gait Pattern/deviations: Step-through pattern;Decreased stride length;Trunk flexed Gait velocity: decreased Gait velocity interpretation: Below normal speed for age/gender General Gait Details: Pt able to take a few steps towards door before requiring  chair. Pt lightheaded throughout session.     Stairs            Wheelchair Mobility    Modified Rankin (Stroke Patients Only)       Balance Overall balance assessment: Needs assistance Sitting-balance support: Feet supported;Single extremity supported Sitting balance-Leahy Scale: Fair     Standing balance support: Bilateral upper extremity supported;During functional activity Standing balance-Leahy Scale: Poor Standing balance comment: Pt requires B UE support while standing and for ambulation                    Cognition Arousal/Alertness: Awake/alert Behavior During Therapy: WFL for tasks assessed/performed Overall Cognitive Status: Within Functional Limits for tasks assessed                      Exercises Hand Exercises Digit Composite Flexion: AROM;Right;10 reps Digit Composite Abduction: AROM;Right;5 reps Digit Lifts: AROM;Right;10 reps Thumb Adduction: Strengthening;Right;10 reps (yellow putty) Opposition: Strengthening;Right;10 reps;AROM (both AROM and with therapy putty) Other Exercises Other Exercises: Provided AROM R hand coordination exercises as well as therapy putty exercises to strengthen extrinsic and intrinsic hand muslces and improve coordination.    General Comments        Pertinent Vitals/Pain Pain Assessment: 0-10 Pain Score: 6  Pain Location: neck and back Pain Descriptors / Indicators: Aching;Sore;Grimacing;Guarding Pain Intervention(s): Limited activity within patient's tolerance;Monitored during session;Repositioned    Home Living                      Prior Function  PT Goals (current goals can now be found in the care plan section) Acute Rehab PT Goals Patient Stated Goal: to be able to walk; become independent again PT Goal Formulation: With patient Time For Goal Achievement: 09/29/16 Potential to Achieve Goals: Fair Progress towards PT goals: Progressing toward goals    Frequency     Min 3X/week      PT Plan Current plan remains appropriate    Co-evaluation PT/OT/SLP Co-Evaluation/Treatment: Yes Reason for Co-Treatment: For patient/therapist safety PT goals addressed during session: Mobility/safety with mobility;Balance;Strengthening/ROM;Proper use of DME OT goals addressed during session: ADL's and self-care     End of Session   Activity Tolerance: Patient limited by fatigue Patient left: in chair;with call bell/phone within reach;Other (comment) (with OT)     Time: 2060-1561 PT Time Calculation (min) (ACUTE ONLY): 17 min  Charges:  $Gait Training: 8-22 mins                    G Codes:      Tonia Brooms October 05, 2016, 1:41 PM Tonia Brooms, SPT [phone]

## 2016-09-17 NOTE — Sedation Documentation (Signed)
Pt tolerating procedure very well. No complaints at this time

## 2016-09-17 NOTE — Procedures (Signed)
Interventional Radiology Procedure Note  Procedure: CT guided biopsy of right lung mass.   Complications: None Recommendations: - Bedrest until CXR cleared.  Minimize talking, coughing or otherwise straining.  - Follow up 1 hr CXR pending  - Supine position for recovery  Signed,  Dulcy Fanny. Earleen Newport, DO

## 2016-09-17 NOTE — Progress Notes (Addendum)
Call placed to patient's niece: Morey Hummingbird. Updated regarding plan and most likely discharge this weekend once medically cleared. Facility aware as well of discharge for weekend and agreeable. Family requesting EMS at [facility]. Weekend SW will complete discharge and facilitate transport.  [employer] authorization has been obtained and patient can discharge to SNF: [facility] when medically stable.  LCSW will continue to follow.  Lane Hacker, MSW Clinical Social Work: [employer] Coverage for :  [phone]

## 2016-09-17 NOTE — Care Management Important Message (Signed)
Important Message  Patient Details  Name: [name] MRN: [id number] Date of Birth: June 25, 1930   Medicare Important Message Given:  Yes    Raydel Hosick 09/17/2016, 11:02 AM

## 2016-09-17 NOTE — Sedation Documentation (Signed)
Pt  tolerated procedure well.

## 2016-09-17 NOTE — Progress Notes (Signed)
Occupational Therapy Treatment Patient Details Name: [name] MRN: [id number] DOB: Aug 15, 1930 Today's Date: 09/17/2016    History of present illness Pt admitted on 09/13/16 with dizziness and fall. She struck her head but did not lose consciousness. She has had some tingling in her R arm as well as a tremor. MRI showed Lt SDH and Lt posterior enhancing mass.  PTA, pt independent and lives at [facility].             OT comments  Pt progressing well toward OT goals. Pt able to complete simulated functional toilet transfer with min assist +2 for chair follow. Pt continues to report lightheadedness with mobility. BP 161/86 when standing. Pt with much decreased activity tolerance for ADL requiring seated therapeutic rest break after a few minutes. Pt reporting decreasing R hand coordination associated with weakness over the past month, but worsening prior to fall and this limits her ability to complete ADL. Provided R hand coordination and strengthening exercises to improve while admitted. OT will continue to follow acutely. D/C plan remains appropriate.   Follow Up Recommendations  SNF    Equipment Recommendations  Other (comment) (TBD at next venue of care)    Recommendations for Other Services      Precautions / Restrictions Precautions Precautions: Fall Required Braces or Orthoses: Other Brace/Splint Other Brace/Splint: [place] collar Restrictions Weight Bearing Restrictions: No       Mobility Bed Mobility               General bed mobility comments: pt in chair on OT arrival  Transfers Overall transfer level: Needs assistance Equipment used: Rolling walker (2 wheeled) Transfers: Sit to/from Stand Sit to Stand: Min assist              Balance Overall balance assessment: Needs assistance Sitting-balance support: Feet supported;Single extremity supported Sitting balance-Leahy Scale: Fair     Standing balance support: Bilateral upper  extremity supported;During functional activity Standing balance-Leahy Scale: Poor Standing balance comment: Pt requires B UE support while standing and for ambulation                   ADL Overall ADL's : Needs assistance/impaired                     Lower Body Dressing: Total assistance;Sit to/from stand   Toilet Transfer: Minimal assistance;+2 for safety/equipment;BSC;Ambulation (chair follow)           Functional mobility during ADLs: Minimal assistance;+2 for safety/equipment;Rolling walker (chair follow) General ADL Comments: Pt educated on R UE coordination and strengthening HEP.       Vision                 Additional Comments: Pt with macular degeneration at baseline   Perception     Praxis      Cognition   Behavior During Therapy: [facility] for tasks assessed/performed Overall Cognitive Status: Within Functional Limits for tasks assessed                       Extremity/Trunk Assessment               Exercises Hand Exercises Digit Composite Flexion: AROM;Right;10 reps Digit Composite Abduction: AROM;Right;5 reps Digit Lifts: AROM;Right;10 reps Thumb Adduction: Strengthening;Right;10 reps (yellow putty) Opposition: Strengthening;Right;10 reps;AROM (both AROM and with therapy putty) Other Exercises Other Exercises: Provided AROM R hand coordination exercises as well as therapy putty exercises to strengthen extrinsic and  intrinsic hand muslces and improve coordination.   Shoulder Instructions       General Comments      Pertinent Vitals/ Pain       Pain Assessment: 0-10 Pain Score: 6  Pain Location: neck and back Pain Descriptors / Indicators: Aching;Sore;Grimacing;Guarding Pain Intervention(s): Limited activity within patient's tolerance;Monitored during session;Repositioned  Home Living                                          Prior Functioning/Environment              Frequency  Min 2X/week         Progress Toward Goals  OT Goals(current goals can now be found in the care plan section)  Progress towards OT goals: Progressing toward goals  Acute Rehab OT Goals Patient Stated Goal: to be able to walk; become independent again OT Goal Formulation: With patient Time For Goal Achievement: 09/22/16 Potential to Achieve Goals: Good ADL Goals Pt Will Perform Grooming: with min assist;with min guard assist;sitting Pt Will Perform Upper Body Bathing: with max assist;with mod assist;sitting Pt Will Perform Upper Body Dressing: with max assist;with mod assist;sitting Pt Will Perform Lower Body Dressing: with max assist;sitting/lateral leans Pt Will Transfer to Toilet: with max assist;with mod assist;bedside commode Additional ADL Goal #1: Pt will demonstrate proper techniques for bed mobility to sit EOB for ADL tasks  Plan Discharge plan remains appropriate    Co-evaluation    PT/OT/SLP Co-Evaluation/Treatment: Yes Reason for Co-Treatment: For patient/therapist safety PT goals addressed during session: Mobility/safety with mobility;Balance;Strengthening/ROM;Proper use of DME OT goals addressed during session: ADL's and self-care      End of Session Equipment Utilized During Treatment: Cervical collar   Activity Tolerance Patient limited by pain;Patient limited by fatigue   Patient Left in chair;with call bell/phone within reach   Nurse Communication          Time: 7615-1834 OT Time Calculation (min): 35 min  Charges: OT General Charges $OT Visit: 1 Procedure OT Treatments $Self Care/Home Management : 8-22 mins  Norman Herrlich, OTR/L [phone] 09/17/2016, 1:29 PM

## 2016-09-17 NOTE — Clinical Social Work Note (Signed)
Clinical Social Work Assessment  Patient Details  Name: [name] MRN: [id number] Date of Birth: 02-18-1930  Date of referral:  09/17/16               Reason for consult:  Facility Placement (admitted from [facility], needs higher level of care)                Permission sought to share information with:  Case Manager, Customer service manager, Family Supports Permission granted to share information::  Yes, Verbal Permission Granted  Name::        Agency::  [facility]  Relationship::  family  Contact Information:     Housing/Transportation Living arrangements for the past 2 months:  [place] of Information:  Patient, Medical Team, Case Freight forwarder, Facility Patient Interpreter Needed:  None Criminal Activity/Legal Involvement Pertinent to Current Situation/Hospitalization:  No - Comment as needed Significant Relationships:  Other Family Members, Community Support Lives with:  Self, Facility Resident Do you feel safe going back to the place where you live?  Yes Need for family participation in patient care:  No (Coment)  Care giving concerns:  Patient is a resident at [facility] and needs a higher level of care due to falls and current medical status.  Patient in agreement with SNF plan and insurance authorization has been completed.   Social Worker assessment / plan:  LCSW has received call from facility and consult for placement. Agreeable for SNF bed at [facility] and working actively on insurance authorization.  LCSW has completed work up for SNF and faxed information to facility.  Insurance has been approved per facility and they can take patient at any time.  Employment status:  Retired Nurse, adult PT Recommendations:    Information / Referral to community resources:  [place]  Patient/Family's Response to care:  Agreeable to plan Patient/Family's Understanding of and Emotional  Response to Diagnosis, Current Treatment, and Prognosis:  Understanding of need for higher level of care at facility.  Emotional Assessment Appearance:  Appears stated age Attitude/Demeanor/Rapport:    Affect (typically observed):  Accepting, Adaptable Orientation:  Oriented to Self, Oriented to Place, Oriented to  Time, Oriented to Situation Alcohol / Substance use:  Not Applicable Psych involvement (Current and /or in the community):  No (Comment)  Discharge Needs  Concerns to be addressed:  No discharge needs identified Readmission within the last 30 days:  No Current discharge risk:  None Barriers to Discharge:  Continued Medical Work up, [place], [place], [place] 09/17/2016, 10:57 AM

## 2016-09-17 NOTE — Progress Notes (Addendum)
PROGRESS NOTE    [name]  WUJ:[id number] DOB: Apr 26, 1930 DOA: 09/13/2016  PCP: Yong Channel, MD   Brief Narrative:  [name] is a 82 y.o. female with medical history significant of HTN, atrial fibrillation on Xarelto, uterine cancer s/p and skin cancer; who presents after having a fall at home. She hit the right side of her face and noted significant bleeding. ER eval revealed a 4 mm subdural hematoma and a left frontal mass with vasogenic edema. Further workup with CT scans revealed a right lung mass. She has been evaluated by neurosurgery and interventional radiology.  Subjective: She has no complaints other than mild chronic pain in the back of her neck.  Assessment & Plan:   Principal Problem:   Subdural hematoma  - Has been evaluated by neurosurgery on 12/19-small and not causing mass effect -Xarelto and aspirin on hold  Active Problems: - Left frontal mass -Possible metastasis from lung versus primary -Keppra twice a day started by neuro -Xarelto on hold -Steroids started as well -Plan for radiation pending biopsy results - Follow up with Dr. Cyndy Freeze  Type II odontoid fracture with minimal displacement - Manage with c-collar per neurosurgery  Right lung mass - I have spoken with pulmonary and IR in regards to obtaining a biopsy -she has undergone an IR guided biopsy today    A-fib CHA2DS2-VASc Score >4  -Xarelto being held - Continue metoprolol and Cardizem  Hyponatremia - Mild-dehydrated?-Will give a slow normal saline bolus 500 mL - HCTZ  held  Hypertension - holding HCTZ    Coronary artery disease - Aspirin on hold - cont statins  Prior Uterine Cancer Rx [place] with XRT in 1985  DVT prophylaxis: SCDs Code Status: DO NOT RESUSCITATE Family Communication:  Disposition Plan: SNF Consultants:   IR  Neurosurgery Procedures:   CT guided biopsy Antimicrobials:  Anti-infectives    None       Objective: Vitals:   09/17/16 1523 09/17/16  1529 09/17/16 1532 09/17/16 1536  BP: (!) 162/60 (!) 148/62 (!) 143/63 (!) 148/62  Pulse: (!) 59 (!) 56 (!) 55 (!) 55  Resp: '12 20 12 20  '$ Temp:      TempSrc:      SpO2: 97% 92% 97% 97%  Weight:      Height:        Intake/Output Summary (Last 24 hours) at 09/17/16 1545 Last data filed at 09/17/16 1356  Gross per 24 hour  Intake              120 ml  Output             1350 ml  Net            -1230 ml   Filed Weights   09/14/16 0335 09/14/16 2033  Weight: 87 kg (191 lb 12.8 oz) 89 kg (196 lb 3.4 oz)    Examination: General exam: Appears comfortable  HEENT: PERRLA, oral mucosa moist, no sclera icterus or thrush- bruising on left side of face- in c-collar Respiratory system: Clear to auscultation. Respiratory effort normal. Cardiovascular system: S1 & S2 heard, RRR.  No murmurs  Gastrointestinal system: Abdomen soft, non-tender, nondistended. Normal bowel sound. No organomegaly Central nervous system: Alert and oriented. No focal neurological deficits. Extremities: No cyanosis, clubbing or edema Skin: No rashes or ulcers Psychiatry:  Mood & affect appropriate.     Data Reviewed: I have personally reviewed following labs and imaging studies  CBC:  Recent Labs Lab 09/13/16 2130 09/14/16 0421 09/15/16  1497 09/16/16 0853 09/17/16 0527  WBC 13.8* 8.8 9.8 10.6* 9.4  NEUTROABS 11.9*  --   --   --   --   HGB 13.9 12.7 12.3 13.6 13.5  HCT 42.5 39.6 37.3 40.9 40.7  MCV 83.0 83.7 81.4 81.3 81.4  PLT 218 187 218 212 026   Basic Metabolic Panel:  Recent Labs Lab 09/13/16 2130 09/14/16 0421 09/16/16 0853 09/17/16 0527  NA 136 134* 131* 133*  K 3.6 3.4* 4.2 4.2  CL 97* 95* 91* 94*  CO2 '31 28 30 29  '$ GLUCOSE 180* 164* 156* 150*  BUN 24* 17 30* 30*  CREATININE 0.81 0.67 0.94 0.84  CALCIUM 8.9 8.4* 9.1 8.8*   GFR: Estimated Creatinine Clearance: 57.1 mL/min (by C-G formula based on SCr of 0.84 mg/dL). Liver Function Tests:  Recent Labs Lab 09/13/16 2130  09/16/16 0853 09/17/16 0527  AST '28 23 20  '$ ALT 11* 11* 10*  ALKPHOS 97 87 78  BILITOT 0.6 0.8 0.9  PROT 6.0* 5.6* 5.5*  ALBUMIN 3.4* 3.0* 3.0*   No results for input(s): LIPASE, AMYLASE in the last 168 hours. No results for input(s): AMMONIA in the last 168 hours. Coagulation Profile: No results for input(s): INR, PROTIME in the last 168 hours. Cardiac Enzymes:  Recent Labs Lab 09/13/16 2130  TROPONINI <0.03   BNP (last 3 results) No results for input(s): PROBNP in the last 8760 hours. HbA1C: No results for input(s): HGBA1C in the last 72 hours. CBG: No results for input(s): GLUCAP in the last 168 hours. Lipid Profile: No results for input(s): CHOL, HDL, LDLCALC, TRIG, CHOLHDL, LDLDIRECT in the last 72 hours. Thyroid Function Tests: No results for input(s): TSH, T4TOTAL, FREET4, T3FREE, THYROIDAB in the last 72 hours. Anemia Panel: No results for input(s): VITAMINB12, FOLATE, FERRITIN, TIBC, IRON, RETICCTPCT in the last 72 hours. Urine analysis:    Component Value Date/Time   COLORURINE STRAW (A) 09/13/2016 2100   APPEARANCEUR CLEAR 09/13/2016 2100   LABSPEC 1.011 09/13/2016 2100   PHURINE 8.0 09/13/2016 2100   GLUCOSEU NEGATIVE 09/13/2016 2100   HGBUR LARGE (A) 09/13/2016 2100   BILIRUBINUR NEGATIVE 09/13/2016 2100   KETONESUR NEGATIVE 09/13/2016 2100   PROTEINUR NEGATIVE 09/13/2016 2100   UROBILINOGEN 1.0 02/24/2009 1527   NITRITE NEGATIVE 09/13/2016 2100   LEUKOCYTESUR NEGATIVE 09/13/2016 2100   Sepsis Labs: '@LABRCNTIP'$ (procalcitonin:4,lacticidven:4) ) Recent Results (from the past 240 hour(s))  MRSA PCR Screening     Status: None   Collection Time: 09/14/16  3:34 AM  Result Value Ref Range Status   MRSA by PCR NEGATIVE NEGATIVE Final    Comment:        The GeneXpert MRSA Assay (FDA approved for NASAL specimens only), is one component of a comprehensive MRSA colonization surveillance program. It is not intended to diagnose MRSA infection nor to guide  or monitor treatment for MRSA infections.          Radiology Studies: Ct Chest W Contrast  Result Date: 09/16/2016 CLINICAL DATA:  82 year old female with history of brain tumor, with recent dizziness and history of fall yesterday. Multiple cervical spine fractures. EXAM: CT CHEST, ABDOMEN, AND PELVIS WITH CONTRAST TECHNIQUE: Multidetector CT imaging of the chest, abdomen and pelvis was performed following the standard protocol during bolus administration of intravenous contrast. CONTRAST:  1 ISOVUE-300 IOPAMIDOL (ISOVUE-300) INJECTION 61% COMPARISON:  No priors. FINDINGS: CT CHEST FINDINGS Cardiovascular: Heart size is mildly enlarged. Trace amount of pericardial fluid and/or thickening adjacent to the left ventricle, unlikely to  be of any hemodynamic significance at this time. Small amount of pericardial calcification overlying the right atrioventricular groove inferiorly (image 47 of series 2). There is aortic atherosclerosis, as well as atherosclerosis of the great vessels of the mediastinum and the coronary arteries, including calcified atherosclerotic plaque in the left main, left anterior descending, left circumflex and right coronary arteries. Mediastinum/Nodes: Borderline enlarged 9 mm short axis right hilar lymph node (axial image 25 of series 2). No other mediastinal or left hilar lymphadenopathy is noted. Esophagus is unremarkable in appearance. No axillary lymphadenopathy. Lungs/Pleura: In the mid right lower lobe there is a 5.1 x 3.5 x 3.9 cm macrolobulated mass which makes contact with the overlying pleura (image 96 of series 3 and coronal image 109 of series 5). Most of the basal segmental bronchi to the right lower lobe appear occluded, either with soft tissue wall or retained secretions. Mild postobstructive changes are noted distal to the mass in the posterior basal aspect of the right lower lobe, but the right lower lobe is otherwise relatively well aerated. 6 mm nodule in the  lateral segment of the right middle lobe (image 85 of series 3). A few other scattered 1-3 mm pulmonary nodules are noted throughout the lungs bilaterally, highly nonspecific. No acute consolidative airspace disease. No pleural effusions. Musculoskeletal: No acute displaced fractures or aggressive appearing lytic or blastic lesions are noted in the visualized portions of the skeleton. CT ABDOMEN PELVIS FINDINGS Hepatobiliary: No cystic or solid hepatic lesions are noted. No intra or extrahepatic biliary ductal dilatation. There is amorphous high attenuation material lying dependently in the gallbladder, compatible with biliary sludge (although some of this may reflect vicarious excretion of gadolinium related to recent MRI examination). No findings to suggest an acute cholecystitis at this time. Pancreas: No pancreatic mass. No pancreatic ductal dilatation. No pancreatic or peripancreatic fluid or inflammatory changes. Spleen: In the medial aspect of the spleen there is a 1.8 cm hypervascular lesions which appears to normalize to the remainder of splenic parenchyma on delayed images, possibly indicative of a hemangioma. Adrenals/Urinary Tract: In the anterior aspect of the interpolar region of the left kidney there is a a 2.1 cm simple cyst. Multiple other subcentimeter low-attenuation lesions are noted in the kidneys bilaterally, too small to characterize, but favored to represent cysts. In addition, however, there are multiple intermediate to high attenuation lesions in both kidneys which are incompletely characterized on today's examination. Several of these demonstrate no appreciable change in attenuation between portal venous phase and delayed images, favored to represent proteinaceous/hemorrhagic cysts, but overall incompletely characterized without noncontrast images. The largest of these lesions measure up to 2.2 cm in the lower pole of the right kidney. No hydroureteronephrosis. Urinary bladder is normal in  appearance. Stomach/Bowel: The stomach is nearly decompressed, but otherwise unremarkable in appearance. There is no pathologic dilatation of small bowel or colon. Several colonic diverticulae are noted, without definite surrounding inflammatory changes to suggest an acute diverticulitis at this time. The appendix is not confidently identified and may be surgically absent. Regardless, there are no inflammatory changes noted adjacent to the cecum to suggest the presence of an acute appendicitis at this time. Vascular/Lymphatic: Aortic atherosclerosis, without aneurysm or dissection in the abdominal or pelvic vasculature. No lymphadenopathy is noted in the abdomen or pelvis. Reproductive: Status post hysterectomy. Ovaries are not confidently identified may be surgically absent or atrophic. Other: No significant volume of ascites.  No pneumoperitoneum. Musculoskeletal: No acute displaced fractures in the visualized portions of the  skeleton. Chronic appearing compression fracture of L3 with approximately 60% loss of central vertebral body height. There are no aggressive appearing lytic or blastic lesions noted in the visualized portions of the skeleton. Well-circumscribed low-attenuation lesion in the subcutaneous fat of the left lumbar region measuring 1.9 cm in diameter (image 64 of series 2) is nonspecific, but likely a sebaceous cyst. IMPRESSION: 1. No signs of significant acute traumatic injury to the chest, abdomen or pelvis. 2. 5.1 x 3.5 x 3.9 cm right lower lobe mass with borderline enlarged right hilar lymph node, highly concerning for primary bronchogenic neoplasm. Further evaluation with PET-CT and/or biopsy is recommended in the near future for diagnostic and staging purposes. 3. Multiple indeterminate renal lesions in the right kidney. Although these are favored to represent proteinaceous/hemorrhagic cysts, further evaluation with MRI of the abdomen with and without IV gadolinium could provide  characterization if clinically appropriate. 4. Colonic diverticulosis without evidence of acute diverticulitis at this time. 5. Aortic atherosclerosis, in addition to left main and 3 vessel coronary artery disease. 6. Additional incidental findings, as above. Electronically Signed   By: Vinnie Langton M.D.   On: 09/16/2016 15:35   Mr Jeri Cos ZO Contrast  Result Date: 09/15/2016 CLINICAL DATA:  Preoperative planning for brain tumor resection. EXAM: MRI HEAD WITHOUT AND WITH CONTRAST TECHNIQUE: Multiplanar, multiecho pulse sequences of the brain and surrounding structures were obtained without and with intravenous contrast. CONTRAST:  13m MULTIHANCE GADOBENATE DIMEGLUMINE 529 MG/ML IV SOLN COMPARISON:  Brain MRI 09/14/2016 FINDINGS: Brain: Left convexity subdural hematoma has undergone redistribution, now primarily adjacent to left parietal lobe, measuring 5 mm. There is multifocal hyperintense T2-weighted signal within the periventricular white matter, most often seen in the setting of chronic microvascular ischemia. Centered within the left precentral gyrus is a peripherally contrast-enhancing mass that measures 1.9 x 1.8 cm. There is surrounding hyperintense T2 weighted signal that extends anteriorly within the left frontal white matter and posteriorly into the left postcentral gyrus. There is no hydrocephalus or midline shift. No age advanced or lobar predominant atrophy. Vascular: Major intracranial arterial and venous sinus flow voids are preserved. No evidence of chronic microhemorrhage or amyloid angiopathy. Skull and upper cervical spine: Known fractures of the odontoid and left occipital condyles are better characterized on recent CT. Large left frontal scalp subgaleal hematoma is again noted. Extensive lateral left scalp soft tissue swelling also present. Sinuses/Orbits: No fluid levels or advanced mucosal thickening. No mastoid effusion. Normal orbits. IMPRESSION: 1. Peripherally contrast-enhancing  mass centered within the left precentral gyrus, measuring approximately 2 cm. Surrounding hyperintense T2 weighted signal may indicate vasogenic edema versus non enhancing tumor. 2. No other intracranial lesions. 3. Redistribution of left subdural hematoma without midline shift or hydrocephalus. 4. Odontoid and occipital condyle fractures are better characterized on recent CT. Electronically Signed   By: KUlyses JarredM.D.   On: 09/15/2016 20:22   Ct Abdomen Pelvis W Contrast  Result Date: 09/16/2016 CLINICAL DATA:  82year old female with history of brain tumor, with recent dizziness and history of fall yesterday. Multiple cervical spine fractures. EXAM: CT CHEST, ABDOMEN, AND PELVIS WITH CONTRAST TECHNIQUE: Multidetector CT imaging of the chest, abdomen and pelvis was performed following the standard protocol during bolus administration of intravenous contrast. CONTRAST:  1 ISOVUE-300 IOPAMIDOL (ISOVUE-300) INJECTION 61% COMPARISON:  No priors. FINDINGS: CT CHEST FINDINGS Cardiovascular: Heart size is mildly enlarged. Trace amount of pericardial fluid and/or thickening adjacent to the left ventricle, unlikely to be of any hemodynamic significance at this  time. Small amount of pericardial calcification overlying the right atrioventricular groove inferiorly (image 47 of series 2). There is aortic atherosclerosis, as well as atherosclerosis of the great vessels of the mediastinum and the coronary arteries, including calcified atherosclerotic plaque in the left main, left anterior descending, left circumflex and right coronary arteries. Mediastinum/Nodes: Borderline enlarged 9 mm short axis right hilar lymph node (axial image 25 of series 2). No other mediastinal or left hilar lymphadenopathy is noted. Esophagus is unremarkable in appearance. No axillary lymphadenopathy. Lungs/Pleura: In the mid right lower lobe there is a 5.1 x 3.5 x 3.9 cm macrolobulated mass which makes contact with the overlying pleura (image  96 of series 3 and coronal image 109 of series 5). Most of the basal segmental bronchi to the right lower lobe appear occluded, either with soft tissue wall or retained secretions. Mild postobstructive changes are noted distal to the mass in the posterior basal aspect of the right lower lobe, but the right lower lobe is otherwise relatively well aerated. 6 mm nodule in the lateral segment of the right middle lobe (image 85 of series 3). A few other scattered 1-3 mm pulmonary nodules are noted throughout the lungs bilaterally, highly nonspecific. No acute consolidative airspace disease. No pleural effusions. Musculoskeletal: No acute displaced fractures or aggressive appearing lytic or blastic lesions are noted in the visualized portions of the skeleton. CT ABDOMEN PELVIS FINDINGS Hepatobiliary: No cystic or solid hepatic lesions are noted. No intra or extrahepatic biliary ductal dilatation. There is amorphous high attenuation material lying dependently in the gallbladder, compatible with biliary sludge (although some of this may reflect vicarious excretion of gadolinium related to recent MRI examination). No findings to suggest an acute cholecystitis at this time. Pancreas: No pancreatic mass. No pancreatic ductal dilatation. No pancreatic or peripancreatic fluid or inflammatory changes. Spleen: In the medial aspect of the spleen there is a 1.8 cm hypervascular lesions which appears to normalize to the remainder of splenic parenchyma on delayed images, possibly indicative of a hemangioma. Adrenals/Urinary Tract: In the anterior aspect of the interpolar region of the left kidney there is a a 2.1 cm simple cyst. Multiple other subcentimeter low-attenuation lesions are noted in the kidneys bilaterally, too small to characterize, but favored to represent cysts. In addition, however, there are multiple intermediate to high attenuation lesions in both kidneys which are incompletely characterized on today's examination.  Several of these demonstrate no appreciable change in attenuation between portal venous phase and delayed images, favored to represent proteinaceous/hemorrhagic cysts, but overall incompletely characterized without noncontrast images. The largest of these lesions measure up to 2.2 cm in the lower pole of the right kidney. No hydroureteronephrosis. Urinary bladder is normal in appearance. Stomach/Bowel: The stomach is nearly decompressed, but otherwise unremarkable in appearance. There is no pathologic dilatation of small bowel or colon. Several colonic diverticulae are noted, without definite surrounding inflammatory changes to suggest an acute diverticulitis at this time. The appendix is not confidently identified and may be surgically absent. Regardless, there are no inflammatory changes noted adjacent to the cecum to suggest the presence of an acute appendicitis at this time. Vascular/Lymphatic: Aortic atherosclerosis, without aneurysm or dissection in the abdominal or pelvic vasculature. No lymphadenopathy is noted in the abdomen or pelvis. Reproductive: Status post hysterectomy. Ovaries are not confidently identified may be surgically absent or atrophic. Other: No significant volume of ascites.  No pneumoperitoneum. Musculoskeletal: No acute displaced fractures in the visualized portions of the skeleton. Chronic appearing compression fracture of L3  with approximately 60% loss of central vertebral body height. There are no aggressive appearing lytic or blastic lesions noted in the visualized portions of the skeleton. Well-circumscribed low-attenuation lesion in the subcutaneous fat of the left lumbar region measuring 1.9 cm in diameter (image 64 of series 2) is nonspecific, but likely a sebaceous cyst. IMPRESSION: 1. No signs of significant acute traumatic injury to the chest, abdomen or pelvis. 2. 5.1 x 3.5 x 3.9 cm right lower lobe mass with borderline enlarged right hilar lymph node, highly concerning for  primary bronchogenic neoplasm. Further evaluation with PET-CT and/or biopsy is recommended in the near future for diagnostic and staging purposes. 3. Multiple indeterminate renal lesions in the right kidney. Although these are favored to represent proteinaceous/hemorrhagic cysts, further evaluation with MRI of the abdomen with and without IV gadolinium could provide characterization if clinically appropriate. 4. Colonic diverticulosis without evidence of acute diverticulitis at this time. 5. Aortic atherosclerosis, in addition to left main and 3 vessel coronary artery disease. 6. Additional incidental findings, as above. Electronically Signed   By: Vinnie Langton M.D.   On: 09/16/2016 15:35      Scheduled Meds: . atorvastatin  40 mg Oral q1800  . dexamethasone  2 mg Oral Q8H  . diltiazem  240 mg Oral Daily  . ezetimibe  10 mg Oral QHS  . fentaNYL      . levETIRAcetam  500 mg Oral BID  . lidocaine (PF)      . metoprolol succinate  50 mg Oral Daily  . midazolam      . pantoprazole  40 mg Oral BID  . polyvinyl alcohol  1 drop Both Eyes Daily   Continuous Infusions:   LOS: 3 days    Time spent in minutes: 5    Kadi Hession, MD [facility] Pager: [email or web address] Password [facility] 09/17/2016, 3:45 PM

## 2016-09-17 NOTE — Consult Note (Signed)
Chief Complaint: Patient was seen in consultation today for CT guided right lung mass biopsy Chief Complaint  Patient presents with  . Fall  . Head Injury    Referring Physician(s): Rizwan,S  Supervising Physician: Corrie Mckusick  Patient Status: [facility] - In-pt  History of Present Illness: [name] is a 82 y.o. female with remote history of uterine cancer and prior radiation therapy in a hilar 1985, atrial flutter/fib on Xarelto, essential tremor, hypertension, hyperlipidemia, GERD, and coronary artery disease with prior stenting. Patient recently experienced presyncopal episode with right hand tingling and subsequent fall at independent living facility. CT of the head/ MRI  revealed a small left frontotemporal subdural hemorrhage as well as anterior parafalcine subdural hemorrhage. There was a nondisplaced comminuted appearing fracture right occipital condyle , nondisplaced fracture of the right posterior arch of C1, type III fracture of the odontoid process with 2 mm posterior displacement as well as a heterogeneous mass at the left frontoparietal convexity with associated vasogenic edema suspicious for intracranial metastasis or primary CNS neoplasm. Follow-up CT chest abdomen pelvis revealed right lower lobe lung mass, and multiple indeterminate  lesions of the right kidney. Request now received for CT-guided right lung mass biopsy for further evaluation.  Past Medical History:  Diagnosis Date  . A-fib ([place])   . Coronary artery disease   . Hypertension   . MI (myocardial infarction)   . Skin cancer   . Subdural hematoma ([place]) 09/13/2016  . Uterine cancer [facility])     Past Surgical History:  Procedure Laterality Date  . ABDOMINAL HYSTERECTOMY    . APPENDECTOMY    . CORONARY ANGIOPLASTY WITH STENT PLACEMENT    . REPLACEMENT TOTAL KNEE    . TONSILLECTOMY      Allergies: Codeine; Dabigatran; Iodine; Penicillins; and Sulfa antibiotics  Medications: Prior to Admission  medications   Medication Sig Start Date End Date Taking? Authorizing Provider  acetaminophen (TYLENOL) 325 MG tablet Take 325-650 mg by mouth every 6 (six) hours as needed (pain).    Yes Historical Provider, MD  Ascorbic Acid (VITAMIN C) 1000 MG tablet Take 1,000 mg by mouth daily.   Yes Historical Provider, MD  aspirin EC 81 MG tablet Take 81 mg by mouth daily.   Yes Historical Provider, MD  atorvastatin (LIPITOR) 40 MG tablet Take 40 mg by mouth at bedtime.    Yes Historical Provider, MD  beta carotene w/minerals (OCUVITE) tablet Take 2 tablets by mouth daily.   Yes Historical Provider, MD  Calcium Carb-Cholecalciferol (CALCIUM 1000 + D PO) Take 1,000 mg by mouth daily.   Yes Historical Provider, MD  diltiazem (TIAZAC) 240 MG 24 hr capsule Take 240 mg by mouth daily.  07/07/16  Yes Historical Provider, MD  ezetimibe (ZETIA) 10 MG tablet Take 10 mg by mouth at bedtime.    Yes Historical Provider, MD  hydrochlorothiazide (HYDRODIURIL) 25 MG tablet Take 12.5 mg by mouth daily.    Yes Historical Provider, MD  metoprolol succinate (TOPROL-XL) 50 MG 24 hr tablet Take 50 mg by mouth daily.  07/29/16  Yes Historical Provider, MD  mirabegron ER (MYRBETRIQ) 50 MG TB24 tablet Take 50 mg by mouth daily.   Yes Historical Provider, MD  omeprazole (PRILOSEC) 40 MG capsule Take 40 mg by mouth daily.   Yes Historical Provider, MD  Propylene Glycol-Glycerin (SOOTHE OP) Place 1 drop into both eyes daily.   Yes Historical Provider, MD  rivaroxaban (XARELTO) 20 MG TABS tablet Take 20 mg by mouth daily  with breakfast.    Yes Historical Provider, MD  Zinc 50 MG TABS Take 50 mg by mouth daily.   Yes Historical Provider, MD  levETIRAcetam (KEPPRA) 500 MG tablet Take 1 tablet (500 mg total) by mouth 2 (two) times daily. 09/14/16   Kevan Ny Ditty, MD  oxyCODONE-acetaminophen (PERCOCET/ROXICET) 5-325 MG tablet Take 2 tablets by mouth every 4 (four) hours as needed for moderate pain. 09/17/16   Debbe Odea, MD      History reviewed. No pertinent family history.  Social History   Social History  . Marital status: Single    Spouse name: N/A  . Number of children: N/A  . Years of education: N/A   Social History Main Topics  . Smoking status: Never Smoker  . Smokeless tobacco: Never Used  . Alcohol use No  . Drug use: No  . Sexual activity: Not Asked   Other Topics Concern  . None   Social History Narrative  . None      Review of Systems  patient denies fever, chest pain, dyspnea, cough, abdominal pain, nausea, vomiting or abnormal bleeding. She does have headache, back pain, right hand weakness Vital Signs: BP (!) 173/65 (BP Location: Left Arm)   Pulse (!) 58   Temp 97.7 F (36.5 C) (Oral)   Resp 16   Ht '5\' 7"'$  (1.702 m)   Wt 196 lb 3.4 oz (89 kg)   SpO2 92%   BMI 30.73 kg/m   Physical Exam patient awake, alert. C -collar in place. Forehead and left eye hematomas with associated ecchymoses; chest clear to auscultation bilaterally anteriorly. Heart with slightly bradycardic but regular rhythm. Abdomen soft, positive bowel sounds, nontender. Lower extremities- no edema, mild right hand weakness.  Mallampati Score:     Imaging: Dg Chest 2 View  Result Date: 09/13/2016 CLINICAL DATA:  Initial evaluation for acute dizziness, fall. EXAM: CHEST  2 VIEW COMPARISON:  Prior radiograph from 01/01/2009. FINDINGS: Moderate cardiomegaly, slightly progressed relative to most recent radiograph from 2010. Mediastinal silhouette within normal limits. Aortic atherosclerosis, also slightly progressed. Lungs normally inflated. Mild diffuse interstitial prominence, suggesting mild interstitial edema. No focal infiltrates. No significant pleural effusion. No pneumothorax. No acute osseous abnormality. IMPRESSION: 1. Cardiomegaly with mild diffuse interstitial congestion/edema. Cardiomegaly has mildly progressed relative to most recent radiograph from 01/01/2009. 2. No other active cardiopulmonary  disease. 3. Aortic atherosclerosis, also progressed relative to 2010. Electronically Signed   By: Jeannine Boga M.D.   On: 09/13/2016 21:40   Dg Tibia/fibula Right  Result Date: 09/13/2016 CLINICAL DATA:  Initial evaluation for acute trauma, fall. EXAM: RIGHT TIBIA AND FIBULA - 2 VIEW COMPARISON:  None. FINDINGS: There is no evidence of fracture or other focal bone lesions. So no acute soft tissue abnormality about the leg scattered atheromatous vascular calcifications noted posterior to the knee. Advanced degenerative changes noted about the partially visualized knee. IMPRESSION: 1. No acute osseous abnormality about the right tibia/fibula. 2. Advanced degenerative osteoarthrosis about the right knee. 3. Atherosclerotic disease. Electronically Signed   By: Jeannine Boga M.D.   On: 09/13/2016 21:42   Ct Head Wo Contrast  Result Date: 09/13/2016 CLINICAL DATA:  82 year old female with dizziness and fall. History of breast cancer. EXAM: CT HEAD WITHOUT CONTRAST CT MAXILLOFACIAL WITHOUT CONTRAST CT CERVICAL SPINE WITHOUT CONTRAST TECHNIQUE: Multidetector CT imaging of the head, cervical spine, and maxillofacial structures were performed using the standard protocol without intravenous contrast. Multiplanar CT image reconstructions of the cervical spine and maxillofacial structures were  also generated. COMPARISON:  Head CT dated 02/24/2009 FINDINGS: CT HEAD FINDINGS Brain: There is a left frontotemporal subdural hemorrhage measuring up to 4 mm in thickness. Right anterior parafalcine subdural hemorrhage measures 4 mm in thickness. An area of white matter edema noted over the left frontal convexity compatible with vasogenic edema. There is mild mass effect and effacement of the adjacent sulci. A 1.2 x 0.9 cm density in the left frontal convexity (series 2, image 23 and sagittal series 5 image 41) is concerning for a primary neoplasm versus metastatic disease. Further evaluation with MRI without and  with contrast is recommended. There is mild age-related atrophy and chronic microvascular ischemic changes. No midline shift noted. The quadrigeminal plate cistern is patent. Vascular: No hyperdense vessel or unexpected calcification. Skull: Nondisplaced fracture of the right occipital condyle. No other calcaneal fracture. Other: Large left forehead hematoma. CT MAXILLOFACIAL FINDINGS Osseous: No fracture or mandibular dislocation. No destructive process. Chronic degenerative changes at the left TMJ. Orbits: Bilateral cataract surgeries noted. The globes and retro-orbital fat are preserved. Sinuses: Clear. Soft tissues: Left forehead hematoma. CT CERVICAL SPINE FINDINGS Alignment: No acute subluxation. Skull base and vertebrae: There is nondisplaced comminuted appearing fracture of the right occipital condyle. There is nondisplaced fracture of the right posterior arch of C1. There is incomplete bony fusion of the posterior ring of C1. There is type III fracture of the base of the odontoid process with approximately 2 mm posterior displacement of the tip of the odontoid. Evaluation for fracture is very limited due to advanced osteopenia. There is apparent ankylosis of the C2-C5 posterior elements related to chronic changes and inflammation. Soft tissues and spinal canal: No prevertebral fluid or swelling. No visible canal hematoma. Disc levels:  Multilevel degenerative changes. Upper chest: Bilateral carotid bulb atherosclerotic disease. Other: None IMPRESSION: Left frontal white matter vasogenic edema with suspected underlying mass/metastatic disease. MRI without and with contrast is recommended for further evaluation. Small left frontotemporal subdural hemorrhage as well as anterior parafalcine subdural hemorrhage. No midline shift. Nondisplaced comminuted appearing fracture of the right occipital condyle. Nondisplaced fracture of the right posterior arch of C1. Type III fracture of the odontoid process with 2 mm  posterior displacement. These results were called by telephone at the time of interpretation on 09/13/2016 at 9:42 pm to Dr. Davonna Belling , who verbally acknowledged these results. Electronically Signed   By: Anner Crete M.D.   On: 09/13/2016 21:48   Ct Chest W Contrast  Result Date: 09/16/2016 CLINICAL DATA:  82 year old female with history of brain tumor, with recent dizziness and history of fall yesterday. Multiple cervical spine fractures. EXAM: CT CHEST, ABDOMEN, AND PELVIS WITH CONTRAST TECHNIQUE: Multidetector CT imaging of the chest, abdomen and pelvis was performed following the standard protocol during bolus administration of intravenous contrast. CONTRAST:  1 ISOVUE-300 IOPAMIDOL (ISOVUE-300) INJECTION 61% COMPARISON:  No priors. FINDINGS: CT CHEST FINDINGS Cardiovascular: Heart size is mildly enlarged. Trace amount of pericardial fluid and/or thickening adjacent to the left ventricle, unlikely to be of any hemodynamic significance at this time. Small amount of pericardial calcification overlying the right atrioventricular groove inferiorly (image 47 of series 2). There is aortic atherosclerosis, as well as atherosclerosis of the great vessels of the mediastinum and the coronary arteries, including calcified atherosclerotic plaque in the left main, left anterior descending, left circumflex and right coronary arteries. Mediastinum/Nodes: Borderline enlarged 9 mm short axis right hilar lymph node (axial image 25 of series 2). No other mediastinal or left hilar lymphadenopathy  is noted. Esophagus is unremarkable in appearance. No axillary lymphadenopathy. Lungs/Pleura: In the mid right lower lobe there is a 5.1 x 3.5 x 3.9 cm macrolobulated mass which makes contact with the overlying pleura (image 96 of series 3 and coronal image 109 of series 5). Most of the basal segmental bronchi to the right lower lobe appear occluded, either with soft tissue wall or retained secretions. Mild  postobstructive changes are noted distal to the mass in the posterior basal aspect of the right lower lobe, but the right lower lobe is otherwise relatively well aerated. 6 mm nodule in the lateral segment of the right middle lobe (image 85 of series 3). A few other scattered 1-3 mm pulmonary nodules are noted throughout the lungs bilaterally, highly nonspecific. No acute consolidative airspace disease. No pleural effusions. Musculoskeletal: No acute displaced fractures or aggressive appearing lytic or blastic lesions are noted in the visualized portions of the skeleton. CT ABDOMEN PELVIS FINDINGS Hepatobiliary: No cystic or solid hepatic lesions are noted. No intra or extrahepatic biliary ductal dilatation. There is amorphous high attenuation material lying dependently in the gallbladder, compatible with biliary sludge (although some of this may reflect vicarious excretion of gadolinium related to recent MRI examination). No findings to suggest an acute cholecystitis at this time. Pancreas: No pancreatic mass. No pancreatic ductal dilatation. No pancreatic or peripancreatic fluid or inflammatory changes. Spleen: In the medial aspect of the spleen there is a 1.8 cm hypervascular lesions which appears to normalize to the remainder of splenic parenchyma on delayed images, possibly indicative of a hemangioma. Adrenals/Urinary Tract: In the anterior aspect of the interpolar region of the left kidney there is a a 2.1 cm simple cyst. Multiple other subcentimeter low-attenuation lesions are noted in the kidneys bilaterally, too small to characterize, but favored to represent cysts. In addition, however, there are multiple intermediate to high attenuation lesions in both kidneys which are incompletely characterized on today's examination. Several of these demonstrate no appreciable change in attenuation between portal venous phase and delayed images, favored to represent proteinaceous/hemorrhagic cysts, but overall  incompletely characterized without noncontrast images. The largest of these lesions measure up to 2.2 cm in the lower pole of the right kidney. No hydroureteronephrosis. Urinary bladder is normal in appearance. Stomach/Bowel: The stomach is nearly decompressed, but otherwise unremarkable in appearance. There is no pathologic dilatation of small bowel or colon. Several colonic diverticulae are noted, without definite surrounding inflammatory changes to suggest an acute diverticulitis at this time. The appendix is not confidently identified and may be surgically absent. Regardless, there are no inflammatory changes noted adjacent to the cecum to suggest the presence of an acute appendicitis at this time. Vascular/Lymphatic: Aortic atherosclerosis, without aneurysm or dissection in the abdominal or pelvic vasculature. No lymphadenopathy is noted in the abdomen or pelvis. Reproductive: Status post hysterectomy. Ovaries are not confidently identified may be surgically absent or atrophic. Other: No significant volume of ascites.  No pneumoperitoneum. Musculoskeletal: No acute displaced fractures in the visualized portions of the skeleton. Chronic appearing compression fracture of L3 with approximately 60% loss of central vertebral body height. There are no aggressive appearing lytic or blastic lesions noted in the visualized portions of the skeleton. Well-circumscribed low-attenuation lesion in the subcutaneous fat of the left lumbar region measuring 1.9 cm in diameter (image 64 of series 2) is nonspecific, but likely a sebaceous cyst. IMPRESSION: 1. No signs of significant acute traumatic injury to the chest, abdomen or pelvis. 2. 5.1 x 3.5 x 3.9 cm  right lower lobe mass with borderline enlarged right hilar lymph node, highly concerning for primary bronchogenic neoplasm. Further evaluation with PET-CT and/or biopsy is recommended in the near future for diagnostic and staging purposes. 3. Multiple indeterminate renal  lesions in the right kidney. Although these are favored to represent proteinaceous/hemorrhagic cysts, further evaluation with MRI of the abdomen with and without IV gadolinium could provide characterization if clinically appropriate. 4. Colonic diverticulosis without evidence of acute diverticulitis at this time. 5. Aortic atherosclerosis, in addition to left main and 3 vessel coronary artery disease. 6. Additional incidental findings, as above. Electronically Signed   By: Vinnie Langton M.D.   On: 09/16/2016 15:35   Ct Cervical Spine Wo Contrast  Result Date: 09/13/2016 CLINICAL DATA:  81 year old female with dizziness and fall. History of breast cancer. EXAM: CT HEAD WITHOUT CONTRAST CT MAXILLOFACIAL WITHOUT CONTRAST CT CERVICAL SPINE WITHOUT CONTRAST TECHNIQUE: Multidetector CT imaging of the head, cervical spine, and maxillofacial structures were performed using the standard protocol without intravenous contrast. Multiplanar CT image reconstructions of the cervical spine and maxillofacial structures were also generated. COMPARISON:  Head CT dated 02/24/2009 FINDINGS: CT HEAD FINDINGS Brain: There is a left frontotemporal subdural hemorrhage measuring up to 4 mm in thickness. Right anterior parafalcine subdural hemorrhage measures 4 mm in thickness. An area of white matter edema noted over the left frontal convexity compatible with vasogenic edema. There is mild mass effect and effacement of the adjacent sulci. A 1.2 x 0.9 cm density in the left frontal convexity (series 2, image 23 and sagittal series 5 image 41) is concerning for a primary neoplasm versus metastatic disease. Further evaluation with MRI without and with contrast is recommended. There is mild age-related atrophy and chronic microvascular ischemic changes. No midline shift noted. The quadrigeminal plate cistern is patent. Vascular: No hyperdense vessel or unexpected calcification. Skull: Nondisplaced fracture of the right occipital condyle.  No other calcaneal fracture. Other: Large left forehead hematoma. CT MAXILLOFACIAL FINDINGS Osseous: No fracture or mandibular dislocation. No destructive process. Chronic degenerative changes at the left TMJ. Orbits: Bilateral cataract surgeries noted. The globes and retro-orbital fat are preserved. Sinuses: Clear. Soft tissues: Left forehead hematoma. CT CERVICAL SPINE FINDINGS Alignment: No acute subluxation. Skull base and vertebrae: There is nondisplaced comminuted appearing fracture of the right occipital condyle. There is nondisplaced fracture of the right posterior arch of C1. There is incomplete bony fusion of the posterior ring of C1. There is type III fracture of the base of the odontoid process with approximately 2 mm posterior displacement of the tip of the odontoid. Evaluation for fracture is very limited due to advanced osteopenia. There is apparent ankylosis of the C2-C5 posterior elements related to chronic changes and inflammation. Soft tissues and spinal canal: No prevertebral fluid or swelling. No visible canal hematoma. Disc levels:  Multilevel degenerative changes. Upper chest: Bilateral carotid bulb atherosclerotic disease. Other: None IMPRESSION: Left frontal white matter vasogenic edema with suspected underlying mass/metastatic disease. MRI without and with contrast is recommended for further evaluation. Small left frontotemporal subdural hemorrhage as well as anterior parafalcine subdural hemorrhage. No midline shift. Nondisplaced comminuted appearing fracture of the right occipital condyle. Nondisplaced fracture of the right posterior arch of C1. Type III fracture of the odontoid process with 2 mm posterior displacement. These results were called by telephone at the time of interpretation on 09/13/2016 at 9:42 pm to Dr. Davonna Belling , who verbally acknowledged these results. Electronically Signed   By: Laren Everts.D.  On: 09/13/2016 21:48   Mr Jeri Cos DT Contrast  Result  Date: 09/15/2016 CLINICAL DATA:  Preoperative planning for brain tumor resection. EXAM: MRI HEAD WITHOUT AND WITH CONTRAST TECHNIQUE: Multiplanar, multiecho pulse sequences of the brain and surrounding structures were obtained without and with intravenous contrast. CONTRAST:  90m MULTIHANCE GADOBENATE DIMEGLUMINE 529 MG/ML IV SOLN COMPARISON:  Brain MRI 09/14/2016 FINDINGS: Brain: Left convexity subdural hematoma has undergone redistribution, now primarily adjacent to left parietal lobe, measuring 5 mm. There is multifocal hyperintense T2-weighted signal within the periventricular white matter, most often seen in the setting of chronic microvascular ischemia. Centered within the left precentral gyrus is a peripherally contrast-enhancing mass that measures 1.9 x 1.8 cm. There is surrounding hyperintense T2 weighted signal that extends anteriorly within the left frontal white matter and posteriorly into the left postcentral gyrus. There is no hydrocephalus or midline shift. No age advanced or lobar predominant atrophy. Vascular: Major intracranial arterial and venous sinus flow voids are preserved. No evidence of chronic microhemorrhage or amyloid angiopathy. Skull and upper cervical spine: Known fractures of the odontoid and left occipital condyles are better characterized on recent CT. Large left frontal scalp subgaleal hematoma is again noted. Extensive lateral left scalp soft tissue swelling also present. Sinuses/Orbits: No fluid levels or advanced mucosal thickening. No mastoid effusion. Normal orbits. IMPRESSION: 1. Peripherally contrast-enhancing mass centered within the left precentral gyrus, measuring approximately 2 cm. Surrounding hyperintense T2 weighted signal may indicate vasogenic edema versus non enhancing tumor. 2. No other intracranial lesions. 3. Redistribution of left subdural hematoma without midline shift or hydrocephalus. 4. Odontoid and occipital condyle fractures are better characterized on  recent CT. Electronically Signed   By: KUlyses JarredM.D.   On: 09/15/2016 20:22   Mr BJeri CosWOIContrast  Result Date: 09/14/2016 CLINICAL DATA:  Initial evaluation for brain mass. EXAM: MRI HEAD WITHOUT AND WITH CONTRAST TECHNIQUE: Multiplanar, multiecho pulse sequences of the brain and surrounding structures were obtained without and with intravenous contrast. CONTRAST:  126mMULTIHANCE GADOBENATE DIMEGLUMINE 529 MG/ML IV SOLN COMPARISON:  Prior CT from 09/13/2016. FINDINGS: Brain: Study degraded by motion artifact. Cerebral volume within normal limits for age. Patchy T2/FLAIR hyperintensity within the periventricular and deep white matter both cerebral hemispheres most consistent with chronic small vessel ischemic disease, mild in nature. There is a heterogeneous enhancing mass centered at the left frontal parietal convexity that measures 21 x 22 x 16 mm. Associated vasogenic edema within the left frontotemporal region without significant mass effect. No other mass lesion or abnormal enhancement identified. Differential considerations include possible primary CNS neoplasm or solitary metastasis. Diffusion-weighted imaging demonstrates no evidence for acute ischemic infarct. Acute subdural hematoma overlying the left frontotemporal convexity again noted. This measures up to 5 mm in maximal thickness. No midline shift. No hydrocephalus. Incidental note made of a partially empty sella. Midline structures intact. Vascular: Major intracranial vascular flow voids are preserved. Skull and upper cervical spine: Acute odontoid fracture noted, better evaluated on prior CT. Right occipital condyle fracture also better visualized on CT. Craniocervical junction remains widely patent. Visualized upper cervical spine within normal limits without acute injury. Large hematoma at the left frontal scalp with associated swelling. Sinuses/Orbits: Globes intact. No definite retro-orbital pathology. Mild left-sided proptosis.  Patient is status post lens extraction. Paranasal sinuses are clear. No mastoid effusion. Inner ear structures normal. IMPRESSION: 1. 21 x 22 x 16 mm heterogeneous Lee enhancing mass at the left frontal parietal convexity with associated vasogenic edema. No other  intracranial mass lesion identified. Finding may reflect a solitary intracranial metastasis or primary CNS neoplasm. 2. Acute left frontotemporal subdural hematoma measuring up to 5 mm without significant mass effect. No midline shift. 3. Acute odontoid and right occipital condyle fractures, better evaluated on prior CT. Craniocervical juncture remains widely patent, with no MRI evidence for acute injury to the visualized upper cervical spinal cord. 4. Large hematoma at the left frontal scalp. Electronically Signed   By: Jeannine Boga M.D.   On: 09/14/2016 03:55   Ct Abdomen Pelvis W Contrast  Result Date: 09/16/2016 CLINICAL DATA:  82 year old female with history of brain tumor, with recent dizziness and history of fall yesterday. Multiple cervical spine fractures. EXAM: CT CHEST, ABDOMEN, AND PELVIS WITH CONTRAST TECHNIQUE: Multidetector CT imaging of the chest, abdomen and pelvis was performed following the standard protocol during bolus administration of intravenous contrast. CONTRAST:  1 ISOVUE-300 IOPAMIDOL (ISOVUE-300) INJECTION 61% COMPARISON:  No priors. FINDINGS: CT CHEST FINDINGS Cardiovascular: Heart size is mildly enlarged. Trace amount of pericardial fluid and/or thickening adjacent to the left ventricle, unlikely to be of any hemodynamic significance at this time. Small amount of pericardial calcification overlying the right atrioventricular groove inferiorly (image 47 of series 2). There is aortic atherosclerosis, as well as atherosclerosis of the great vessels of the mediastinum and the coronary arteries, including calcified atherosclerotic plaque in the left main, left anterior descending, left circumflex and right coronary  arteries. Mediastinum/Nodes: Borderline enlarged 9 mm short axis right hilar lymph node (axial image 25 of series 2). No other mediastinal or left hilar lymphadenopathy is noted. Esophagus is unremarkable in appearance. No axillary lymphadenopathy. Lungs/Pleura: In the mid right lower lobe there is a 5.1 x 3.5 x 3.9 cm macrolobulated mass which makes contact with the overlying pleura (image 96 of series 3 and coronal image 109 of series 5). Most of the basal segmental bronchi to the right lower lobe appear occluded, either with soft tissue wall or retained secretions. Mild postobstructive changes are noted distal to the mass in the posterior basal aspect of the right lower lobe, but the right lower lobe is otherwise relatively well aerated. 6 mm nodule in the lateral segment of the right middle lobe (image 85 of series 3). A few other scattered 1-3 mm pulmonary nodules are noted throughout the lungs bilaterally, highly nonspecific. No acute consolidative airspace disease. No pleural effusions. Musculoskeletal: No acute displaced fractures or aggressive appearing lytic or blastic lesions are noted in the visualized portions of the skeleton. CT ABDOMEN PELVIS FINDINGS Hepatobiliary: No cystic or solid hepatic lesions are noted. No intra or extrahepatic biliary ductal dilatation. There is amorphous high attenuation material lying dependently in the gallbladder, compatible with biliary sludge (although some of this may reflect vicarious excretion of gadolinium related to recent MRI examination). No findings to suggest an acute cholecystitis at this time. Pancreas: No pancreatic mass. No pancreatic ductal dilatation. No pancreatic or peripancreatic fluid or inflammatory changes. Spleen: In the medial aspect of the spleen there is a 1.8 cm hypervascular lesions which appears to normalize to the remainder of splenic parenchyma on delayed images, possibly indicative of a hemangioma. Adrenals/Urinary Tract: In the anterior  aspect of the interpolar region of the left kidney there is a a 2.1 cm simple cyst. Multiple other subcentimeter low-attenuation lesions are noted in the kidneys bilaterally, too small to characterize, but favored to represent cysts. In addition, however, there are multiple intermediate to high attenuation lesions in both kidneys which are incompletely characterized  on today's examination. Several of these demonstrate no appreciable change in attenuation between portal venous phase and delayed images, favored to represent proteinaceous/hemorrhagic cysts, but overall incompletely characterized without noncontrast images. The largest of these lesions measure up to 2.2 cm in the lower pole of the right kidney. No hydroureteronephrosis. Urinary bladder is normal in appearance. Stomach/Bowel: The stomach is nearly decompressed, but otherwise unremarkable in appearance. There is no pathologic dilatation of small bowel or colon. Several colonic diverticulae are noted, without definite surrounding inflammatory changes to suggest an acute diverticulitis at this time. The appendix is not confidently identified and may be surgically absent. Regardless, there are no inflammatory changes noted adjacent to the cecum to suggest the presence of an acute appendicitis at this time. Vascular/Lymphatic: Aortic atherosclerosis, without aneurysm or dissection in the abdominal or pelvic vasculature. No lymphadenopathy is noted in the abdomen or pelvis. Reproductive: Status post hysterectomy. Ovaries are not confidently identified may be surgically absent or atrophic. Other: No significant volume of ascites.  No pneumoperitoneum. Musculoskeletal: No acute displaced fractures in the visualized portions of the skeleton. Chronic appearing compression fracture of L3 with approximately 60% loss of central vertebral body height. There are no aggressive appearing lytic or blastic lesions noted in the visualized portions of the skeleton.  Well-circumscribed low-attenuation lesion in the subcutaneous fat of the left lumbar region measuring 1.9 cm in diameter (image 64 of series 2) is nonspecific, but likely a sebaceous cyst. IMPRESSION: 1. No signs of significant acute traumatic injury to the chest, abdomen or pelvis. 2. 5.1 x 3.5 x 3.9 cm right lower lobe mass with borderline enlarged right hilar lymph node, highly concerning for primary bronchogenic neoplasm. Further evaluation with PET-CT and/or biopsy is recommended in the near future for diagnostic and staging purposes. 3. Multiple indeterminate renal lesions in the right kidney. Although these are favored to represent proteinaceous/hemorrhagic cysts, further evaluation with MRI of the abdomen with and without IV gadolinium could provide characterization if clinically appropriate. 4. Colonic diverticulosis without evidence of acute diverticulitis at this time. 5. Aortic atherosclerosis, in addition to left main and 3 vessel coronary artery disease. 6. Additional incidental findings, as above. Electronically Signed   By: Vinnie Langton M.D.   On: 09/16/2016 15:35   Ct Maxillofacial Wo Contrast  Result Date: 09/13/2016 CLINICAL DATA:  82 year old female with dizziness and fall. History of breast cancer. EXAM: CT HEAD WITHOUT CONTRAST CT MAXILLOFACIAL WITHOUT CONTRAST CT CERVICAL SPINE WITHOUT CONTRAST TECHNIQUE: Multidetector CT imaging of the head, cervical spine, and maxillofacial structures were performed using the standard protocol without intravenous contrast. Multiplanar CT image reconstructions of the cervical spine and maxillofacial structures were also generated. COMPARISON:  Head CT dated 02/24/2009 FINDINGS: CT HEAD FINDINGS Brain: There is a left frontotemporal subdural hemorrhage measuring up to 4 mm in thickness. Right anterior parafalcine subdural hemorrhage measures 4 mm in thickness. An area of white matter edema noted over the left frontal convexity compatible with  vasogenic edema. There is mild mass effect and effacement of the adjacent sulci. A 1.2 x 0.9 cm density in the left frontal convexity (series 2, image 23 and sagittal series 5 image 41) is concerning for a primary neoplasm versus metastatic disease. Further evaluation with MRI without and with contrast is recommended. There is mild age-related atrophy and chronic microvascular ischemic changes. No midline shift noted. The quadrigeminal plate cistern is patent. Vascular: No hyperdense vessel or unexpected calcification. Skull: Nondisplaced fracture of the right occipital condyle. No other calcaneal fracture. Other:  Large left forehead hematoma. CT MAXILLOFACIAL FINDINGS Osseous: No fracture or mandibular dislocation. No destructive process. Chronic degenerative changes at the left TMJ. Orbits: Bilateral cataract surgeries noted. The globes and retro-orbital fat are preserved. Sinuses: Clear. Soft tissues: Left forehead hematoma. CT CERVICAL SPINE FINDINGS Alignment: No acute subluxation. Skull base and vertebrae: There is nondisplaced comminuted appearing fracture of the right occipital condyle. There is nondisplaced fracture of the right posterior arch of C1. There is incomplete bony fusion of the posterior ring of C1. There is type III fracture of the base of the odontoid process with approximately 2 mm posterior displacement of the tip of the odontoid. Evaluation for fracture is very limited due to advanced osteopenia. There is apparent ankylosis of the C2-C5 posterior elements related to chronic changes and inflammation. Soft tissues and spinal canal: No prevertebral fluid or swelling. No visible canal hematoma. Disc levels:  Multilevel degenerative changes. Upper chest: Bilateral carotid bulb atherosclerotic disease. Other: None IMPRESSION: Left frontal white matter vasogenic edema with suspected underlying mass/metastatic disease. MRI without and with contrast is recommended for further evaluation. Small left  frontotemporal subdural hemorrhage as well as anterior parafalcine subdural hemorrhage. No midline shift. Nondisplaced comminuted appearing fracture of the right occipital condyle. Nondisplaced fracture of the right posterior arch of C1. Type III fracture of the odontoid process with 2 mm posterior displacement. These results were called by telephone at the time of interpretation on 09/13/2016 at 9:42 pm to Dr. Davonna Belling , who verbally acknowledged these results. Electronically Signed   By: Anner Crete M.D.   On: 09/13/2016 21:48    Labs:  CBC:  Recent Labs  09/14/16 0421 09/15/16 0305 09/16/16 0853 09/17/16 0527  WBC 8.8 9.8 10.6* 9.4  HGB 12.7 12.3 13.6 13.5  HCT 39.6 37.3 40.9 40.7  PLT 187 218 212 218    COAGS:  Recent Labs  12/17/15 1045  INR 2.58*    BMP:  Recent Labs  09/13/16 2130 09/14/16 0421 09/16/16 0853 09/17/16 0527  NA 136 134* 131* 133*  K 3.6 3.4* 4.2 4.2  CL 97* 95* 91* 94*  CO2 '31 28 30 29  '$ GLUCOSE 180* 164* 156* 150*  BUN 24* 17 30* 30*  CALCIUM 8.9 8.4* 9.1 8.8*  CREATININE 0.81 0.67 0.94 0.84  GFRNONAA >60 >60 54* >60  GFRAA >60 >60 >60 >60    LIVER FUNCTION TESTS:  Recent Labs  12/17/15 1045 09/13/16 2130 09/16/16 0853 09/17/16 0527  BILITOT 0.5 0.6 0.8 0.9  AST '21 28 23 20  '$ ALT 8* 11* 11* 10*  ALKPHOS 96 97 87 78  PROT 6.2* 6.0* 5.6* 5.5*  ALBUMIN 3.6 3.4* 3.0* 3.0*    TUMOR MARKERS: No results for input(s): AFPTM, CEA, CA199, CHROMGRNA in the last 8760 hours.  Assessment and Plan: 82 y.o. female with remote history of uterine cancer and prior radiation therapy in a hilar 1985, atrial flutter/fib on Xarelto, essential tremor, hypertension, hyperlipidemia, GERD, and coronary artery disease with prior stenting. Patient recently experienced presyncopal episode with right hand tingling and subsequent fall at independent living facility. CT of the head/ MRI  revealed a small left frontotemporal subdural hemorrhage as  well as anterior parafalcine subdural hemorrhage. There was a nondisplaced comminuted appearing fracture right occipital condyle , nondisplaced fracture of the right posterior arch of C1, type III fracture of the odontoid process with 2 mm posterior displacement as well as a heterogeneous mass at the left frontoparietal convexity with associated vasogenic edema suspicious for intracranial  metastasis or primary CNS neoplasm. Follow-up CT chest abdomen pelvis revealed right lower lobe lung mass, and multiple indeterminate  lesions of the right kidney. Request now received for CT-guided right lung mass biopsy for further evaluation. Imaging studies were reviewed by Dr. Earleen Newport. Right lung mass appears amenable to CT-guided biopsy. Pulmonology service (Dr. Nelda Marseille) was also consulted and felt that mass was not amenable to bronchoscopic biopsy.Risks and benefits discussed with the patient/niece including, but not limited to bleeding, infection, damage to adjacent structures/pneumothorax requiring chest tube placement , low yield requiring additional tests and death. All of the patient's questions were answered, patient is agreeable to proceed. Consent signed and in chart. Procedure planned for later today. Clearance obtained from neurosurgery to proceed with lung biopsy as well.      Thank you for this interesting consult.  I greatly enjoyed meeting [name] and look forward to participating in their care.  A copy of this report was sent to the requesting provider on this date.  Electronically Signed: D. Rowe Robert 09/17/2016, 12:52 PM   I spent a total of 40 minutes  in face to face in clinical consultation, greater than 50% of which was counseling/coordinating care for CT guided right lung mass biopsy

## 2016-09-18 DIAGNOSIS — R918 Other nonspecific abnormal finding of lung field: Secondary | ICD-10-CM

## 2016-09-18 DIAGNOSIS — C55 Malignant neoplasm of uterus, part unspecified: Secondary | ICD-10-CM

## 2016-09-18 LAB — BASIC METABOLIC PANEL
Anion gap: 8 (ref 5–15)
BUN: 32 mg/dL — AB (ref 6–20)
CALCIUM: 8.5 mg/dL — AB (ref 8.9–10.3)
CO2: 33 mmol/L — AB (ref 22–32)
CREATININE: 0.85 mg/dL (ref 0.44–1.00)
Chloride: 94 mmol/L — ABNORMAL LOW (ref 101–111)
GFR calc non Af Amer: >60 mL/min (ref >60)
Glucose, Bld: 145 mg/dL — ABNORMAL HIGH (ref 65–99)
Potassium: 4.1 mmol/L (ref 3.5–5.1)
SODIUM: 135 mmol/L (ref 135–145)

## 2016-09-18 MED ORDER — DEXAMETHASONE 2 MG PO TABS: 2.0000 mg | ORAL_TABLET | Freq: Three times a day (TID) | ORAL | Status: AC

## 2016-09-18 MED ORDER — OXYCODONE-ACETAMINOPHEN 5-325 MG PO TABS: 1.0000 | ORAL_TABLET | ORAL | 0 refills | Status: AC | PRN

## 2016-09-18 MED ORDER — POLYETHYLENE GLYCOL 3350 17 G PO PACK: 17.0000 g | PACK | Freq: Every day | ORAL | 0 refills | Status: AC | PRN

## 2016-09-18 MED ORDER — BISACODYL 10 MG RE SUPP: 10.0000 mg | RECTAL | 0 refills | Status: AC | PRN

## 2016-09-18 NOTE — Discharge Summary (Signed)
Physician Discharge Summary  [name] JEH:[id number] DOB: 1977-03-23 DOA: 09/13/2016  PCP: Yong Channel, MD  Admit date: 09/13/2016 Discharge date: 09/18/2016  Admitted From: independent living  Disposition:  SNF   Recommendations for Outpatient Follow-up:  1. she has undergone an IR guided biopsy 12/22 which will need to be followed as outpt by PCP 2. Needs close f/u with Neurosurgery in 1 wk- Dr Ditty aware 3. Will be transitioned from hospital to sNF   Discharge Condition:  stable   CODE STATUS:  DNR   Diet recommendation:  Heart healthy Consultations:  NS  IR    Discharge Diagnoses:  Principal Problem:   Subdural hematoma (HCC) Active Problems:   Intracranial mass   Mass of right lung   A-fib (HCC)   Coronary artery disease   Hypertension   MI (myocardial infarction)   Uterine cancer (HCC)    Subjective: No complaints today.   Brief Summary: [name] a 82 y.o.femalewith medical history  of HTN, atrial fibrillation on Xarelto, uterine cancer s/p and skin cancer who presents after having a fall at home. She hit the left side of her face and noted significant bleeding. ER eval revealed a left frontal hematoma, estensive left facial bruising, L subdural hematoma and a left frontal mass with vasogenic edema. Further workup with CT scans revealed a right lung mass. She has been evaluated by neurosurgery and interventional radiology.  Hospital Course:  Principal Problem:   L Subdural hematoma  - Has been evaluated by neurosurgery on 12/19-small and not causing mass effect -Xarelto and aspirin on hold  Active Problems: - Left frontal mass  -Possible metastasis from lung versus primary -Keppra twice a day started by neuro --Decadron started as well for mass effect -Xarelto on hold -Plan for stereotactic radiation pending biopsy results - Follow up with Dr. Cyndy Freeze  C1 and Type II odontoid fracture with minimal displacement - Manage with c-collar  per neurosurgery  Left frontal hematoma - stable  Right lung mass - I have spoken with pulmonary and IR in regards to obtaining a biopsy -she has undergone an IR guided biopsy 12/22 which will need to be followed as outpt by PCP    A-fib - CHA2DS2-VASc Score >4  -Xarelto being held as mentioned due to brain mass and subdural hematoma - rate has been well controlled on metoprolol and Cardizem  Hyponatremia - Mild-dehydrated?-  given a slow normal saline bolus 500 mL with improvement - HCTZ  held  Hypertension - holding HCTZ due to hyponatremia -cont metoprolol, Cardizem    Coronary artery disease - Aspirin on hold due to acute Subdural hematoma - cont statins  Prior Uterine [place] with XRT in 1980   Discharge Instructions  Discharge Instructions    Diet - low sodium heart healthy    Complete by:  As directed    Increase activity slowly    Complete by:  As directed      Allergies as of 09/18/2016      Reactions   Codeine Other (See Comments)   Possibly hives   Dabigatran Hives   Reaction to Pradaxa   Iodine Hives   Reaction to topical iodine   Penicillins Hives   Has patient had a PCN reaction causing immediate rash, facial/tongue/throat swelling, SOB or lightheadedness with hypotension: Yes Has patient had a PCN reaction causing severe rash involving mucus membranes or skin necrosis: No Has patient had a PCN reaction that required hospitalization No Has patient had a PCN reaction occurring  within the last 10 years: No If all of the above answers are "NO", then may proceed with Cephalosporin use.   Sulfa Antibiotics Nausea And Vomiting      Medication List    STOP taking these medications   aspirin EC 81 MG tablet   hydrochlorothiazide 25 MG tablet Commonly known as:  HYDRODIURIL   rivaroxaban 20 MG Tabs tablet Commonly known as:  XARELTO     TAKE these medications   acetaminophen 325 MG tablet Commonly known as:  TYLENOL Take 325-650  mg by mouth every 6 (six) hours as needed (pain).   atorvastatin 40 MG tablet Commonly known as:  LIPITOR Take 40 mg by mouth at bedtime.   beta carotene w/minerals tablet Take 2 tablets by mouth daily.   bisacodyl 10 MG suppository Commonly known as:  DULCOLAX Place 1 suppository (10 mg total) rectally as needed for moderate constipation.   CALCIUM 1000 + D PO Take 1,000 mg by mouth daily.   dexamethasone 2 MG tablet Commonly known as:  DECADRON Take 1 tablet (2 mg total) by mouth every 8 (eight) hours.   diltiazem 240 MG 24 hr capsule Commonly known as:  TIAZAC Take 240 mg by mouth daily.   ezetimibe 10 MG tablet Commonly known as:  ZETIA Take 10 mg by mouth at bedtime.   levETIRAcetam 500 MG tablet Commonly known as:  KEPPRA Take 1 tablet (500 mg total) by mouth 2 (two) times daily.   metoprolol succinate 50 MG 24 hr tablet Commonly known as:  TOPROL-XL Take 50 mg by mouth daily.   MYRBETRIQ 50 MG Tb24 tablet Generic drug:  mirabegron ER Take 50 mg by mouth daily.   omeprazole 40 MG capsule Commonly known as:  PRILOSEC Take 40 mg by mouth daily.   oxyCODONE-acetaminophen 5-325 MG tablet Commonly known as:  PERCOCET/ROXICET Take 1 tablet by mouth every 4 (four) hours as needed for moderate pain.   polyethylene glycol packet Commonly known as:  MIRALAX Take 17 g by mouth daily as needed for mild constipation.   SOOTHE OP Place 1 drop into both eyes daily.   vitamin C 1000 MG tablet Take 1,000 mg by mouth daily.   Zinc 50 MG Tabs Take 50 mg by mouth daily.      Follow-up Information    Kevan Ny Ditty, MD. Schedule an appointment as soon as possible for a visit in 1 week(s).   Specialty:  Neurosurgery Contact information: [address] [place] [address] [phone]          Allergies  Allergen Reactions  . Codeine Other (See Comments)    Possibly hives  . Dabigatran Hives    Reaction to Pradaxa  . Iodine Hives     Reaction to topical iodine  . Penicillins Hives    Has patient had a PCN reaction causing immediate rash, facial/tongue/throat swelling, SOB or lightheadedness with hypotension: Yes Has patient had a PCN reaction causing severe rash involving mucus membranes or skin necrosis: No Has patient had a PCN reaction that required hospitalization No Has patient had a PCN reaction occurring within the last 10 years: No If all of the above answers are "NO", then may proceed with Cephalosporin use.  . Sulfa Antibiotics Nausea And Vomiting     Procedures/Studies: CT guided biopsy of lung mass  Dg Chest 2 View  Result Date: 09/13/2016 CLINICAL DATA:  Initial evaluation for acute dizziness, fall. EXAM: CHEST  2 VIEW COMPARISON:  Prior radiograph  from 01/01/2009. FINDINGS: Moderate cardiomegaly, slightly progressed relative to most recent radiograph from 2010. Mediastinal silhouette within normal limits. Aortic atherosclerosis, also slightly progressed. Lungs normally inflated. Mild diffuse interstitial prominence, suggesting mild interstitial edema. No focal infiltrates. No significant pleural effusion. No pneumothorax. No acute osseous abnormality. IMPRESSION: 1. Cardiomegaly with mild diffuse interstitial congestion/edema. Cardiomegaly has mildly progressed relative to most recent radiograph from 01/01/2009. 2. No other active cardiopulmonary disease. 3. Aortic atherosclerosis, also progressed relative to 2010. Electronically Signed   By: Jeannine Boga M.D.   On: 09/13/2016 21:40   Dg Tibia/fibula Right  Result Date: 09/13/2016 CLINICAL DATA:  Initial evaluation for acute trauma, fall. EXAM: RIGHT TIBIA AND FIBULA - 2 VIEW COMPARISON:  None. FINDINGS: There is no evidence of fracture or other focal bone lesions. So no acute soft tissue abnormality about the leg scattered atheromatous vascular calcifications noted posterior to the knee. Advanced degenerative changes noted about the partially  visualized knee. IMPRESSION: 1. No acute osseous abnormality about the right tibia/fibula. 2. Advanced degenerative osteoarthrosis about the right knee. 3. Atherosclerotic disease. Electronically Signed   By: Jeannine Boga M.D.   On: 09/13/2016 21:42   Ct Head Wo Contrast  Result Date: 09/13/2016 CLINICAL DATA:  82 year old female with dizziness and fall. History of breast cancer. EXAM: CT HEAD WITHOUT CONTRAST CT MAXILLOFACIAL WITHOUT CONTRAST CT CERVICAL SPINE WITHOUT CONTRAST TECHNIQUE: Multidetector CT imaging of the head, cervical spine, and maxillofacial structures were performed using the standard protocol without intravenous contrast. Multiplanar CT image reconstructions of the cervical spine and maxillofacial structures were also generated. COMPARISON:  Head CT dated 02/24/2009 FINDINGS: CT HEAD FINDINGS Brain: There is a left frontotemporal subdural hemorrhage measuring up to 4 mm in thickness. Right anterior parafalcine subdural hemorrhage measures 4 mm in thickness. An area of white matter edema noted over the left frontal convexity compatible with vasogenic edema. There is mild mass effect and effacement of the adjacent sulci. A 1.2 x 0.9 cm density in the left frontal convexity (series 2, image 23 and sagittal series 5 image 41) is concerning for a primary neoplasm versus metastatic disease. Further evaluation with MRI without and with contrast is recommended. There is mild age-related atrophy and chronic microvascular ischemic changes. No midline shift noted. The quadrigeminal plate cistern is patent. Vascular: No hyperdense vessel or unexpected calcification. Skull: Nondisplaced fracture of the right occipital condyle. No other calcaneal fracture. Other: Large left forehead hematoma. CT MAXILLOFACIAL FINDINGS Osseous: No fracture or mandibular dislocation. No destructive process. Chronic degenerative changes at the left TMJ. Orbits: Bilateral cataract surgeries noted. The globes and  retro-orbital fat are preserved. Sinuses: Clear. Soft tissues: Left forehead hematoma. CT CERVICAL SPINE FINDINGS Alignment: No acute subluxation. Skull base and vertebrae: There is nondisplaced comminuted appearing fracture of the right occipital condyle. There is nondisplaced fracture of the right posterior arch of C1. There is incomplete bony fusion of the posterior ring of C1. There is type III fracture of the base of the odontoid process with approximately 2 mm posterior displacement of the tip of the odontoid. Evaluation for fracture is very limited due to advanced osteopenia. There is apparent ankylosis of the C2-C5 posterior elements related to chronic changes and inflammation. Soft tissues and spinal canal: No prevertebral fluid or swelling. No visible canal hematoma. Disc levels:  Multilevel degenerative changes. Upper chest: Bilateral carotid bulb atherosclerotic disease. Other: None IMPRESSION: Left frontal white matter vasogenic edema with suspected underlying mass/metastatic disease. MRI without and with contrast is recommended for further  evaluation. Small left frontotemporal subdural hemorrhage as well as anterior parafalcine subdural hemorrhage. No midline shift. Nondisplaced comminuted appearing fracture of the right occipital condyle. Nondisplaced fracture of the right posterior arch of C1. Type III fracture of the odontoid process with 2 mm posterior displacement. These results were called by telephone at the time of interpretation on 09/13/2016 at 9:42 pm to Dr. Davonna Belling , who verbally acknowledged these results. Electronically Signed   By: Anner Crete M.D.   On: 09/13/2016 21:48   Ct Chest W Contrast  Result Date: 09/16/2016 CLINICAL DATA:  82 year old female with history of brain tumor, with recent dizziness and history of fall yesterday. Multiple cervical spine fractures. EXAM: CT CHEST, ABDOMEN, AND PELVIS WITH CONTRAST TECHNIQUE: Multidetector CT imaging of the chest,  abdomen and pelvis was performed following the standard protocol during bolus administration of intravenous contrast. CONTRAST:  1 ISOVUE-300 IOPAMIDOL (ISOVUE-300) INJECTION 61% COMPARISON:  No priors. FINDINGS: CT CHEST FINDINGS Cardiovascular: Heart size is mildly enlarged. Trace amount of pericardial fluid and/or thickening adjacent to the left ventricle, unlikely to be of any hemodynamic significance at this time. Small amount of pericardial calcification overlying the right atrioventricular groove inferiorly (image 47 of series 2). There is aortic atherosclerosis, as well as atherosclerosis of the great vessels of the mediastinum and the coronary arteries, including calcified atherosclerotic plaque in the left main, left anterior descending, left circumflex and right coronary arteries. Mediastinum/Nodes: Borderline enlarged 9 mm short axis right hilar lymph node (axial image 25 of series 2). No other mediastinal or left hilar lymphadenopathy is noted. Esophagus is unremarkable in appearance. No axillary lymphadenopathy. Lungs/Pleura: In the mid right lower lobe there is a 5.1 x 3.5 x 3.9 cm macrolobulated mass which makes contact with the overlying pleura (image 96 of series 3 and coronal image 109 of series 5). Most of the basal segmental bronchi to the right lower lobe appear occluded, either with soft tissue wall or retained secretions. Mild postobstructive changes are noted distal to the mass in the posterior basal aspect of the right lower lobe, but the right lower lobe is otherwise relatively well aerated. 6 mm nodule in the lateral segment of the right middle lobe (image 85 of series 3). A few other scattered 1-3 mm pulmonary nodules are noted throughout the lungs bilaterally, highly nonspecific. No acute consolidative airspace disease. No pleural effusions. Musculoskeletal: No acute displaced fractures or aggressive appearing lytic or blastic lesions are noted in the visualized portions of the  skeleton. CT ABDOMEN PELVIS FINDINGS Hepatobiliary: No cystic or solid hepatic lesions are noted. No intra or extrahepatic biliary ductal dilatation. There is amorphous high attenuation material lying dependently in the gallbladder, compatible with biliary sludge (although some of this may reflect vicarious excretion of gadolinium related to recent MRI examination). No findings to suggest an acute cholecystitis at this time. Pancreas: No pancreatic mass. No pancreatic ductal dilatation. No pancreatic or peripancreatic fluid or inflammatory changes. Spleen: In the medial aspect of the spleen there is a 1.8 cm hypervascular lesions which appears to normalize to the remainder of splenic parenchyma on delayed images, possibly indicative of a hemangioma. Adrenals/Urinary Tract: In the anterior aspect of the interpolar region of the left kidney there is a a 2.1 cm simple cyst. Multiple other subcentimeter low-attenuation lesions are noted in the kidneys bilaterally, too small to characterize, but favored to represent cysts. In addition, however, there are multiple intermediate to high attenuation lesions in both kidneys which are incompletely characterized on today's  examination. Several of these demonstrate no appreciable change in attenuation between portal venous phase and delayed images, favored to represent proteinaceous/hemorrhagic cysts, but overall incompletely characterized without noncontrast images. The largest of these lesions measure up to 2.2 cm in the lower pole of the right kidney. No hydroureteronephrosis. Urinary bladder is normal in appearance. Stomach/Bowel: The stomach is nearly decompressed, but otherwise unremarkable in appearance. There is no pathologic dilatation of small bowel or colon. Several colonic diverticulae are noted, without definite surrounding inflammatory changes to suggest an acute diverticulitis at this time. The appendix is not confidently identified and may be surgically absent.  Regardless, there are no inflammatory changes noted adjacent to the cecum to suggest the presence of an acute appendicitis at this time. Vascular/Lymphatic: Aortic atherosclerosis, without aneurysm or dissection in the abdominal or pelvic vasculature. No lymphadenopathy is noted in the abdomen or pelvis. Reproductive: Status post hysterectomy. Ovaries are not confidently identified may be surgically absent or atrophic. Other: No significant volume of ascites.  No pneumoperitoneum. Musculoskeletal: No acute displaced fractures in the visualized portions of the skeleton. Chronic appearing compression fracture of L3 with approximately 60% loss of central vertebral body height. There are no aggressive appearing lytic or blastic lesions noted in the visualized portions of the skeleton. Well-circumscribed low-attenuation lesion in the subcutaneous fat of the left lumbar region measuring 1.9 cm in diameter (image 64 of series 2) is nonspecific, but likely a sebaceous cyst. IMPRESSION: 1. No signs of significant acute traumatic injury to the chest, abdomen or pelvis. 2. 5.1 x 3.5 x 3.9 cm right lower lobe mass with borderline enlarged right hilar lymph node, highly concerning for primary bronchogenic neoplasm. Further evaluation with PET-CT and/or biopsy is recommended in the near future for diagnostic and staging purposes. 3. Multiple indeterminate renal lesions in the right kidney. Although these are favored to represent proteinaceous/hemorrhagic cysts, further evaluation with MRI of the abdomen with and without IV gadolinium could provide characterization if clinically appropriate. 4. Colonic diverticulosis without evidence of acute diverticulitis at this time. 5. Aortic atherosclerosis, in addition to left main and 3 vessel coronary artery disease. 6. Additional incidental findings, as above. Electronically Signed   By: Vinnie Langton M.D.   On: 09/16/2016 15:35   Ct Cervical Spine Wo Contrast  Result Date:  09/13/2016 CLINICAL DATA:  82 year old female with dizziness and fall. History of breast cancer. EXAM: CT HEAD WITHOUT CONTRAST CT MAXILLOFACIAL WITHOUT CONTRAST CT CERVICAL SPINE WITHOUT CONTRAST TECHNIQUE: Multidetector CT imaging of the head, cervical spine, and maxillofacial structures were performed using the standard protocol without intravenous contrast. Multiplanar CT image reconstructions of the cervical spine and maxillofacial structures were also generated. COMPARISON:  Head CT dated 02/24/2009 FINDINGS: CT HEAD FINDINGS Brain: There is a left frontotemporal subdural hemorrhage measuring up to 4 mm in thickness. Right anterior parafalcine subdural hemorrhage measures 4 mm in thickness. An area of white matter edema noted over the left frontal convexity compatible with vasogenic edema. There is mild mass effect and effacement of the adjacent sulci. A 1.2 x 0.9 cm density in the left frontal convexity (series 2, image 23 and sagittal series 5 image 41) is concerning for a primary neoplasm versus metastatic disease. Further evaluation with MRI without and with contrast is recommended. There is mild age-related atrophy and chronic microvascular ischemic changes. No midline shift noted. The quadrigeminal plate cistern is patent. Vascular: No hyperdense vessel or unexpected calcification. Skull: Nondisplaced fracture of the right occipital condyle. No other calcaneal fracture. Other: Large  left forehead hematoma. CT MAXILLOFACIAL FINDINGS Osseous: No fracture or mandibular dislocation. No destructive process. Chronic degenerative changes at the left TMJ. Orbits: Bilateral cataract surgeries noted. The globes and retro-orbital fat are preserved. Sinuses: Clear. Soft tissues: Left forehead hematoma. CT CERVICAL SPINE FINDINGS Alignment: No acute subluxation. Skull base and vertebrae: There is nondisplaced comminuted appearing fracture of the right occipital condyle. There is nondisplaced fracture of the right  posterior arch of C1. There is incomplete bony fusion of the posterior ring of C1. There is type III fracture of the base of the odontoid process with approximately 2 mm posterior displacement of the tip of the odontoid. Evaluation for fracture is very limited due to advanced osteopenia. There is apparent ankylosis of the C2-C5 posterior elements related to chronic changes and inflammation. Soft tissues and spinal canal: No prevertebral fluid or swelling. No visible canal hematoma. Disc levels:  Multilevel degenerative changes. Upper chest: Bilateral carotid bulb atherosclerotic disease. Other: None IMPRESSION: Left frontal white matter vasogenic edema with suspected underlying mass/metastatic disease. MRI without and with contrast is recommended for further evaluation. Small left frontotemporal subdural hemorrhage as well as anterior parafalcine subdural hemorrhage. No midline shift. Nondisplaced comminuted appearing fracture of the right occipital condyle. Nondisplaced fracture of the right posterior arch of C1. Type III fracture of the odontoid process with 2 mm posterior displacement. These results were called by telephone at the time of interpretation on 09/13/2016 at 9:42 pm to Dr. Davonna Belling , who verbally acknowledged these results. Electronically Signed   By: Anner Crete M.D.   On: 09/13/2016 21:48   Mr Jeri Cos JH Contrast  Result Date: 09/15/2016 CLINICAL DATA:  Preoperative planning for brain tumor resection. EXAM: MRI HEAD WITHOUT AND WITH CONTRAST TECHNIQUE: Multiplanar, multiecho pulse sequences of the brain and surrounding structures were obtained without and with intravenous contrast. CONTRAST:  55m MULTIHANCE GADOBENATE DIMEGLUMINE 529 MG/ML IV SOLN COMPARISON:  Brain MRI 09/14/2016 FINDINGS: Brain: Left convexity subdural hematoma has undergone redistribution, now primarily adjacent to left parietal lobe, measuring 5 mm. There is multifocal hyperintense T2-weighted signal within  the periventricular white matter, most often seen in the setting of chronic microvascular ischemia. Centered within the left precentral gyrus is a peripherally contrast-enhancing mass that measures 1.9 x 1.8 cm. There is surrounding hyperintense T2 weighted signal that extends anteriorly within the left frontal white matter and posteriorly into the left postcentral gyrus. There is no hydrocephalus or midline shift. No age advanced or lobar predominant atrophy. Vascular: Major intracranial arterial and venous sinus flow voids are preserved. No evidence of chronic microhemorrhage or amyloid angiopathy. Skull and upper cervical spine: Known fractures of the odontoid and left occipital condyles are better characterized on recent CT. Large left frontal scalp subgaleal hematoma is again noted. Extensive lateral left scalp soft tissue swelling also present. Sinuses/Orbits: No fluid levels or advanced mucosal thickening. No mastoid effusion. Normal orbits. IMPRESSION: 1. Peripherally contrast-enhancing mass centered within the left precentral gyrus, measuring approximately 2 cm. Surrounding hyperintense T2 weighted signal may indicate vasogenic edema versus non enhancing tumor. 2. No other intracranial lesions. 3. Redistribution of left subdural hematoma without midline shift or hydrocephalus. 4. Odontoid and occipital condyle fractures are better characterized on recent CT. Electronically Signed   By: KUlyses JarredM.D.   On: 09/15/2016 20:22   Mr BJeri CosWERContrast  Result Date: 09/14/2016 CLINICAL DATA:  Initial evaluation for brain mass. EXAM: MRI HEAD WITHOUT AND WITH CONTRAST TECHNIQUE: Multiplanar, multiecho pulse sequences of the  brain and surrounding structures were obtained without and with intravenous contrast. CONTRAST:  72m MULTIHANCE GADOBENATE DIMEGLUMINE 529 MG/ML IV SOLN COMPARISON:  Prior CT from 09/13/2016. FINDINGS: Brain: Study degraded by motion artifact. Cerebral volume within normal limits for  age. Patchy T2/FLAIR hyperintensity within the periventricular and deep white matter both cerebral hemispheres most consistent with chronic small vessel ischemic disease, mild in nature. There is a heterogeneous enhancing mass centered at the left frontal parietal convexity that measures 21 x 22 x 16 mm. Associated vasogenic edema within the left frontotemporal region without significant mass effect. No other mass lesion or abnormal enhancement identified. Differential considerations include possible primary CNS neoplasm or solitary metastasis. Diffusion-weighted imaging demonstrates no evidence for acute ischemic infarct. Acute subdural hematoma overlying the left frontotemporal convexity again noted. This measures up to 5 mm in maximal thickness. No midline shift. No hydrocephalus. Incidental note made of a partially empty sella. Midline structures intact. Vascular: Major intracranial vascular flow voids are preserved. Skull and upper cervical spine: Acute odontoid fracture noted, better evaluated on prior CT. Right occipital condyle fracture also better visualized on CT. Craniocervical junction remains widely patent. Visualized upper cervical spine within normal limits without acute injury. Large hematoma at the left frontal scalp with associated swelling. Sinuses/Orbits: Globes intact. No definite retro-orbital pathology. Mild left-sided proptosis. Patient is status post lens extraction. Paranasal sinuses are clear. No mastoid effusion. Inner ear structures normal. IMPRESSION: 1. 21 x 22 x 16 mm heterogeneous Lee enhancing mass at the left frontal parietal convexity with associated vasogenic edema. No other intracranial mass lesion identified. Finding may reflect a solitary intracranial metastasis or primary CNS neoplasm. 2. Acute left frontotemporal subdural hematoma measuring up to 5 mm without significant mass effect. No midline shift. 3. Acute odontoid and right occipital condyle fractures, better evaluated  on prior CT. Craniocervical juncture remains widely patent, with no MRI evidence for acute injury to the visualized upper cervical spinal cord. 4. Large hematoma at the left frontal scalp. Electronically Signed   By: BJeannine BogaM.D.   On: 09/14/2016 03:55   Ct Abdomen Pelvis W Contrast  Result Date: 09/16/2016 CLINICAL DATA:  82year old female with history of brain tumor, with recent dizziness and history of fall yesterday. Multiple cervical spine fractures. EXAM: CT CHEST, ABDOMEN, AND PELVIS WITH CONTRAST TECHNIQUE: Multidetector CT imaging of the chest, abdomen and pelvis was performed following the standard protocol during bolus administration of intravenous contrast. CONTRAST:  1 ISOVUE-300 IOPAMIDOL (ISOVUE-300) INJECTION 61% COMPARISON:  No priors. FINDINGS: CT CHEST FINDINGS Cardiovascular: Heart size is mildly enlarged. Trace amount of pericardial fluid and/or thickening adjacent to the left ventricle, unlikely to be of any hemodynamic significance at this time. Small amount of pericardial calcification overlying the right atrioventricular groove inferiorly (image 47 of series 2). There is aortic atherosclerosis, as well as atherosclerosis of the great vessels of the mediastinum and the coronary arteries, including calcified atherosclerotic plaque in the left main, left anterior descending, left circumflex and right coronary arteries. Mediastinum/Nodes: Borderline enlarged 9 mm short axis right hilar lymph node (axial image 25 of series 2). No other mediastinal or left hilar lymphadenopathy is noted. Esophagus is unremarkable in appearance. No axillary lymphadenopathy. Lungs/Pleura: In the mid right lower lobe there is a 5.1 x 3.5 x 3.9 cm macrolobulated mass which makes contact with the overlying pleura (image 96 of series 3 and coronal image 109 of series 5). Most of the basal segmental bronchi to the right lower lobe appear occluded, either  with soft tissue wall or retained secretions.  Mild postobstructive changes are noted distal to the mass in the posterior basal aspect of the right lower lobe, but the right lower lobe is otherwise relatively well aerated. 6 mm nodule in the lateral segment of the right middle lobe (image 85 of series 3). A few other scattered 1-3 mm pulmonary nodules are noted throughout the lungs bilaterally, highly nonspecific. No acute consolidative airspace disease. No pleural effusions. Musculoskeletal: No acute displaced fractures or aggressive appearing lytic or blastic lesions are noted in the visualized portions of the skeleton. CT ABDOMEN PELVIS FINDINGS Hepatobiliary: No cystic or solid hepatic lesions are noted. No intra or extrahepatic biliary ductal dilatation. There is amorphous high attenuation material lying dependently in the gallbladder, compatible with biliary sludge (although some of this may reflect vicarious excretion of gadolinium related to recent MRI examination). No findings to suggest an acute cholecystitis at this time. Pancreas: No pancreatic mass. No pancreatic ductal dilatation. No pancreatic or peripancreatic fluid or inflammatory changes. Spleen: In the medial aspect of the spleen there is a 1.8 cm hypervascular lesions which appears to normalize to the remainder of splenic parenchyma on delayed images, possibly indicative of a hemangioma. Adrenals/Urinary Tract: In the anterior aspect of the interpolar region of the left kidney there is a a 2.1 cm simple cyst. Multiple other subcentimeter low-attenuation lesions are noted in the kidneys bilaterally, too small to characterize, but favored to represent cysts. In addition, however, there are multiple intermediate to high attenuation lesions in both kidneys which are incompletely characterized on today's examination. Several of these demonstrate no appreciable change in attenuation between portal venous phase and delayed images, favored to represent proteinaceous/hemorrhagic cysts, but overall  incompletely characterized without noncontrast images. The largest of these lesions measure up to 2.2 cm in the lower pole of the right kidney. No hydroureteronephrosis. Urinary bladder is normal in appearance. Stomach/Bowel: The stomach is nearly decompressed, but otherwise unremarkable in appearance. There is no pathologic dilatation of small bowel or colon. Several colonic diverticulae are noted, without definite surrounding inflammatory changes to suggest an acute diverticulitis at this time. The appendix is not confidently identified and may be surgically absent. Regardless, there are no inflammatory changes noted adjacent to the cecum to suggest the presence of an acute appendicitis at this time. Vascular/Lymphatic: Aortic atherosclerosis, without aneurysm or dissection in the abdominal or pelvic vasculature. No lymphadenopathy is noted in the abdomen or pelvis. Reproductive: Status post hysterectomy. Ovaries are not confidently identified may be surgically absent or atrophic. Other: No significant volume of ascites.  No pneumoperitoneum. Musculoskeletal: No acute displaced fractures in the visualized portions of the skeleton. Chronic appearing compression fracture of L3 with approximately 60% loss of central vertebral body height. There are no aggressive appearing lytic or blastic lesions noted in the visualized portions of the skeleton. Well-circumscribed low-attenuation lesion in the subcutaneous fat of the left lumbar region measuring 1.9 cm in diameter (image 64 of series 2) is nonspecific, but likely a sebaceous cyst. IMPRESSION: 1. No signs of significant acute traumatic injury to the chest, abdomen or pelvis. 2. 5.1 x 3.5 x 3.9 cm right lower lobe mass with borderline enlarged right hilar lymph node, highly concerning for primary bronchogenic neoplasm. Further evaluation with PET-CT and/or biopsy is recommended in the near future for diagnostic and staging purposes. 3. Multiple indeterminate renal  lesions in the right kidney. Although these are favored to represent proteinaceous/hemorrhagic cysts, further evaluation with MRI of the abdomen with and  without IV gadolinium could provide characterization if clinically appropriate. 4. Colonic diverticulosis without evidence of acute diverticulitis at this time. 5. Aortic atherosclerosis, in addition to left main and 3 vessel coronary artery disease. 6. Additional incidental findings, as above. Electronically Signed   By: Vinnie Langton M.D.   On: 09/16/2016 15:35   Ct Biopsy  Result Date: 09/17/2016 INDICATION: 82 year old female with a history of right lower lobe mass. EXAM: CT BIOPSY MEDICATIONS: None. ANESTHESIA/SEDATION: Moderate (conscious) sedation was employed during this procedure. A total of Versed 1.0 mg and Fentanyl 50 mcg was administered intravenously. Moderate Sedation Time: 10 minutes. The patient's level of consciousness and vital signs were monitored continuously by radiology nursing throughout the procedure under my direct supervision. FLUOROSCOPY TIME:  None COMPLICATIONS: None PROCEDURE: The procedure, risks, benefits, and alternatives were explained to the patient and the patient's family. Specific risks that were addressed included bleeding, infection, pneumothorax, need for further procedure including chest tube placement, chance of delayed pneumothorax or hemorrhage, hemoptysis, nondiagnostic sample, cardiopulmonary collapse, death. Questions regarding the procedure were encouraged and answered. The patient understands and consents to the procedure. Patient was positioned in the right acute position on the CT gantry table and a scout CT of the chest was performed for planning purposes. Once angle of approach was determined, the skin and subcutaneous tissues this scan was prepped and draped in the usual sterile fashion, and a sterile drape was applied covering the operative field. A sterile gown and sterile gloves were used for the  procedure. Local anesthesia was provided with 1% Lidocaine. The skin and subcutaneous tissues were infiltrated 1% lidocaine for local anesthesia, and a small stab incision was made with an 11 blade scalpel. Using CT guidance, a 17 gauge trocar needle was advanced into the right lower lobetarget. After confirmation of the tip, separate 18 gauge core biopsies were performed. These were placed into solution for transportation to the lab. Biosentry Device was deployed. A final CT image was performed. Patient tolerated the procedure well and remained hemodynamically stable throughout. No complications were encountered and no significant blood loss was encounter IMPRESSION: Status post CT-guided biopsy of right lower lobe mass. Tissue specimen sent to pathology for complete histopathologic analysis. Signed, Dulcy Fanny. Earleen Newport, DO [facility] Radiology Electronically Signed   By: Corrie Mckusick D.O.   On: 09/17/2016 16:47   Dg Chest Port 1 View  Result Date: 09/17/2016 CLINICAL DATA:  Pneumothorax after biopsy. EXAM: PORTABLE CHEST 1 VIEW COMPARISON:  CT scan from earlier today. FINDINGS: Upright AP portable view of the chest shows no evidence for pneumothorax. Focal nodular opacity projecting over the right heart border is compatible with the patient's known right lower lobe lung mass. No pulmonary edema or substantial pleural effusion. The cardio pericardial silhouette is enlarged. The visualized bony structures of the thorax are intact. Telemetry leads overlie the chest. IMPRESSION: No evidence for pneumothorax. Electronically Signed   By: Misty Stanley M.D.   On: 09/17/2016 17:07   Ct Maxillofacial Wo Contrast  Result Date: 09/13/2016 CLINICAL DATA:  82 year old female with dizziness and fall. History of breast cancer. EXAM: CT HEAD WITHOUT CONTRAST CT MAXILLOFACIAL WITHOUT CONTRAST CT CERVICAL SPINE WITHOUT CONTRAST TECHNIQUE: Multidetector CT imaging of the head,  cervical spine, and maxillofacial structures were performed using the standard protocol without intravenous contrast. Multiplanar CT image reconstructions of the cervical spine and maxillofacial structures were also generated. COMPARISON:  Head CT dated 02/24/2009 FINDINGS: CT HEAD FINDINGS Brain: There is  a left frontotemporal subdural hemorrhage measuring up to 4 mm in thickness. Right anterior parafalcine subdural hemorrhage measures 4 mm in thickness. An area of white matter edema noted over the left frontal convexity compatible with vasogenic edema. There is mild mass effect and effacement of the adjacent sulci. A 1.2 x 0.9 cm density in the left frontal convexity (series 2, image 23 and sagittal series 5 image 41) is concerning for a primary neoplasm versus metastatic disease. Further evaluation with MRI without and with contrast is recommended. There is mild age-related atrophy and chronic microvascular ischemic changes. No midline shift noted. The quadrigeminal plate cistern is patent. Vascular: No hyperdense vessel or unexpected calcification. Skull: Nondisplaced fracture of the right occipital condyle. No other calcaneal fracture. Other: Large left forehead hematoma. CT MAXILLOFACIAL FINDINGS Osseous: No fracture or mandibular dislocation. No destructive process. Chronic degenerative changes at the left TMJ. Orbits: Bilateral cataract surgeries noted. The globes and retro-orbital fat are preserved. Sinuses: Clear. Soft tissues: Left forehead hematoma. CT CERVICAL SPINE FINDINGS Alignment: No acute subluxation. Skull base and vertebrae: There is nondisplaced comminuted appearing fracture of the right occipital condyle. There is nondisplaced fracture of the right posterior arch of C1. There is incomplete bony fusion of the posterior ring of C1. There is type III fracture of the base of the odontoid process with approximately 2 mm posterior displacement of the tip of the odontoid. Evaluation for fracture is  very limited due to advanced osteopenia. There is apparent ankylosis of the C2-C5 posterior elements related to chronic changes and inflammation. Soft tissues and spinal canal: No prevertebral fluid or swelling. No visible canal hematoma. Disc levels:  Multilevel degenerative changes. Upper chest: Bilateral carotid bulb atherosclerotic disease. Other: None IMPRESSION: Left frontal white matter vasogenic edema with suspected underlying mass/metastatic disease. MRI without and with contrast is recommended for further evaluation. Small left frontotemporal subdural hemorrhage as well as anterior parafalcine subdural hemorrhage. No midline shift. Nondisplaced comminuted appearing fracture of the right occipital condyle. Nondisplaced fracture of the right posterior arch of C1. Type III fracture of the odontoid process with 2 mm posterior displacement. These results were called by telephone at the time of interpretation on 09/13/2016 at 9:42 pm to Dr. Davonna Belling , who verbally acknowledged these results. Electronically Signed   By: Anner Crete M.D.   On: 09/13/2016 21:48       Discharge Exam: Vitals:   09/17/16 2109 09/18/16 0624  BP: (!) 141/58 (!) 155/69  Pulse: 60 (!) 57  Resp: 17 17  Temp: 98 F (36.7 C) 98.1 F (36.7 C)   Vitals:   09/17/16 1559 09/17/16 1635 09/17/16 2109 09/18/16 0624  BP: (!) 159/64 (!) 174/69 (!) 141/58 (!) 155/69  Pulse: (!) 112 (!) 55 60 (!) 57  Resp: '20 20 17 17  '$ Temp: 98.5 F (36.9 C) 97.5 F (36.4 C) 98 F (36.7 C) 98.1 F (36.7 C)  TempSrc: Oral Oral Oral Oral  SpO2: 97% 97% 92% 92%  Weight:      Height:        General: Pt is alert, awake, not in acute distress Cardiovascular: RRR, S1/S2 +, no rubs, no gallops Respiratory: CTA bilaterally, no wheezing, no rhonchi Abdominal: Soft, NT, ND, bowel sounds + Extremities: no edema, no cyanosis    The results of significant diagnostics from this hospitalization (including imaging, microbiology,  ancillary and laboratory) are listed below for reference.     Microbiology: Recent Results (from the past 240 hour(s))  MRSA PCR Screening  Status: None   Collection Time: 09/14/16  3:34 AM  Result Value Ref Range Status   MRSA by PCR NEGATIVE NEGATIVE Final    Comment:        The GeneXpert MRSA Assay (FDA approved for NASAL specimens only), is one component of a comprehensive MRSA colonization surveillance program. It is not intended to diagnose MRSA infection nor to guide or monitor treatment for MRSA infections.      Labs: BNP (last 3 results)  Recent Labs  09/13/16 2130  BNP 28.4   Basic Metabolic Panel:  Recent Labs Lab 09/13/16 2130 09/14/16 0421 09/16/16 0853 09/17/16 0527 09/18/16 0555  NA 136 134* 131* 133* 135  K 3.6 3.4* 4.2 4.2 4.1  CL 97* 95* 91* 94* 94*  CO2 '31 28 30 29 '$ 33*  GLUCOSE 180* 164* 156* 150* 145*  BUN 24* 17 30* 30* 32*  CREATININE 0.81 0.67 0.94 0.84 0.85  CALCIUM 8.9 8.4* 9.1 8.8* 8.5*   Liver Function Tests:  Recent Labs Lab 09/13/16 2130 09/16/16 0853 09/17/16 0527  AST '28 23 20  '$ ALT 11* 11* 10*  ALKPHOS 97 87 78  BILITOT 0.6 0.8 0.9  PROT 6.0* 5.6* 5.5*  ALBUMIN 3.4* 3.0* 3.0*   No results for input(s): LIPASE, AMYLASE in the last 168 hours. No results for input(s): AMMONIA in the last 168 hours. CBC:  Recent Labs Lab 09/13/16 2130 09/14/16 0421 09/15/16 0305 09/16/16 0853 09/17/16 0527  WBC 13.8* 8.8 9.8 10.6* 9.4  NEUTROABS 11.9*  --   --   --   --   HGB 13.9 12.7 12.3 13.6 13.5  HCT 42.5 39.6 37.3 40.9 40.7  MCV 83.0 83.7 81.4 81.3 81.4  PLT 218 187 218 212 218   Cardiac Enzymes:  Recent Labs Lab 09/13/16 2130  TROPONINI <0.03   BNP: Invalid input(s): POCBNP CBG: No results for input(s): GLUCAP in the last 168 hours. D-Dimer No results for input(s): DDIMER in the last 72 hours. Hgb A1c No results for input(s): HGBA1C in the last 72 hours. Lipid Profile No results for input(s): CHOL,  HDL, LDLCALC, TRIG, CHOLHDL, LDLDIRECT in the last 72 hours. Thyroid function studies No results for input(s): TSH, T4TOTAL, T3FREE, THYROIDAB in the last 72 hours.  Invalid input(s): FREET3 Anemia work up No results for input(s): VITAMINB12, FOLATE, FERRITIN, TIBC, IRON, RETICCTPCT in the last 72 hours. Urinalysis    Component Value Date/Time   COLORURINE STRAW (A) 09/13/2016 2100   APPEARANCEUR CLEAR 09/13/2016 2100   LABSPEC 1.011 09/13/2016 2100   PHURINE 8.0 09/13/2016 2100   GLUCOSEU NEGATIVE 09/13/2016 2100   HGBUR LARGE (A) 09/13/2016 2100   BILIRUBINUR NEGATIVE 09/13/2016 2100   KETONESUR NEGATIVE 09/13/2016 2100   PROTEINUR NEGATIVE 09/13/2016 2100   UROBILINOGEN 1.0 02/24/2009 1527   NITRITE NEGATIVE 09/13/2016 2100   LEUKOCYTESUR NEGATIVE 09/13/2016 2100   Sepsis Labs Invalid input(s): PROCALCITONIN,  WBC,  LACTICIDVEN Microbiology Recent Results (from the past 240 hour(s))  MRSA PCR Screening     Status: None   Collection Time: 09/14/16  3:34 AM  Result Value Ref Range Status   MRSA by PCR NEGATIVE NEGATIVE Final    Comment:        The GeneXpert MRSA Assay (FDA approved for NASAL specimens only), is one component of a comprehensive MRSA colonization surveillance program. It is not intended to diagnose MRSA infection nor to guide or monitor treatment for MRSA infections.      Time coordinating discharge: Over 30 minutes  SIGNEDDebbe Odea, MD  [facility] 09/18/2016, 8:50 AM Pager   If 7PM-7AM, please contact night-coverage [email or web address] Password [email or web address]

## 2016-09-18 NOTE — Discharge Planning (Addendum)
At 1300 report called to [facility] and rx faxed to their pharmacy per RN request. D'cd to SNF with all personal belongings per PTAR accompanied by niece at 37.

## 2016-09-18 NOTE — Clinical Social Work Placement (Signed)
Medical Social Worker facilitated patient discharge including contacting patient family and facility to confirm patient discharge plans.  Clinical information faxed to facility and family agreeable with plan.  MSW arranged ambulance transport via [facility] [place] to [facility] [place].  RN to call report prior to discharge.  Medical Social Worker will sign off for now as social work intervention is no longer needed. Please consult [place] again if new need arises.   CLINICAL SOCIAL WORK PLACEMENT  NOTE  Date:  09/18/2016  Patient Details  Name: [name] MRN: [id number] Date of Birth: 25-Jul-1930  Clinical Social Work is seeking post-discharge placement for this patient at the [place] level of care (*CSW will initial, date and re-position this form in  chart as items are completed):  Yes   Patient/family provided with [place] Work Department's list of facilities offering this level of care within the geographic area requested by the patient (or if unable, by the patient's family).  Yes   Patient/family informed of their freedom to choose among providers that offer the needed level of care, that participate in Medicare, Medicaid or managed care program needed by the patient, have an available bed and are willing to accept the patient.  Yes   Patient/family informed of [facility]'s ownership interest in [facility] and [facility], as well as of the fact that they are under no obligation to receive care at these facilities.  PASRR submitted to EDS on 09/15/16     PASRR number received on 09/15/16     Existing PASRR number confirmed on       FL2 transmitted to all facilities in geographic area requested by pt/family on 09/15/16     FL2 transmitted to all facilities within larger geographic area on       Patient informed that his/her managed care company has contracts with or will negotiate with certain facilities, including the following:         Yes   Patient/family informed of bed offers received.  Patient chooses bed at [facility]     Physician recommends and patient chooses bed at      Patient to be transferred to  [facility] [place]) on 09/18/16.  Patient to be transferred to facility by  [facility] )     Patient family notified on 09/18/16 of transfer.  Name of family member notified:   (Pt's friend, [employer] officer )     PHYSICIAN Please sign FL2, Please sign DNR, Please prepare priority discharge summary, including medications     Additional Comment:    _______________________________________________ Glendon Axe A 09/18/2016, 10:07 AM

## 2016-09-21 ENCOUNTER — Telehealth: Payer: Self-pay | Admitting: Internal Medicine

## 2016-09-21 NOTE — Telephone Encounter (Signed)
Left vm on the pt's machine with the appt date and time.

## 2016-09-22 ENCOUNTER — Encounter: Payer: Self-pay | Admitting: Radiation Oncology

## 2016-09-22 ENCOUNTER — Encounter: Payer: Self-pay | Admitting: *Deleted

## 2016-09-22 DIAGNOSIS — R900 Intracranial space-occupying lesion found on diagnostic imaging of central nervous system: Secondary | ICD-10-CM

## 2016-09-22 NOTE — Progress Notes (Signed)
Oncology Nurse Navigator Documentation  Oncology Nurse Navigator Flowsheets 09/22/2016  Navigator Location [facility]  Navigator Encounter Type Other/I received a message from Dr. Lindi Adie to get the patient set up with Rad Onc due to metastatic brain disease.  I completed urgent referral and called specialty brain coordinator and left a message on her vm message regarding referral. I also contacted Dr. Lindi Adie to see if he would like Dr. Julien Nordmann to see patient or he would like to see her.    Treatment Phase Pre-Tx/Tx Discussion  Barriers/Navigation Needs Coordination of Care  Interventions Coordination of Care  Coordination of Care Appts;Other  Acuity Level 2  Acuity Level 2 Assistance expediting appointments  Time Spent with Patient 45

## 2016-09-23 ENCOUNTER — Other Ambulatory Visit: Payer: Self-pay | Admitting: Radiation Therapy

## 2016-09-23 ENCOUNTER — Ambulatory Visit: Payer: Medicare HMO | Admitting: Internal Medicine

## 2016-09-23 DIAGNOSIS — C7949 Secondary malignant neoplasm of other parts of nervous system: Principal | ICD-10-CM

## 2016-09-23 DIAGNOSIS — C7931 Secondary malignant neoplasm of brain: Secondary | ICD-10-CM

## 2016-09-24 ENCOUNTER — Telehealth: Payer: Self-pay | Admitting: Emergency Medicine

## 2016-09-24 NOTE — Telephone Encounter (Signed)
Staff at [facility] asking about plan of care. She was informed of xrt appts. Asking who she will f/u with for med oncology. Pt missed appt yesterday -she did not know about appt.

## 2016-09-26 ENCOUNTER — Other Ambulatory Visit: Payer: Medicare HMO

## 2016-09-27 NOTE — Telephone Encounter (Signed)
It is not clear to [place] if Gudena wants me to see her or he would. He asked Hinton Dyer to arrange for Rad Onc but did not mention much about Med Onc. He is probably planning to see her.

## 2016-09-28 ENCOUNTER — Telehealth: Payer: Self-pay | Admitting: *Deleted

## 2016-09-28 NOTE — Telephone Encounter (Signed)
I called [facility] and spoke with nurse regarding appt for [place] on 09/30/16.  She will arrange transportation for patient.

## 2016-09-28 NOTE — Telephone Encounter (Signed)
Oncology Nurse Navigator Documentation  Oncology Nurse Navigator Flowsheets 09/28/2016  Navigator Location [facility]  Referral date to RadOnc/MedOnc 09/28/2016  Navigator Encounter Type Telephone/I called patient to set her up for [facility].  I left vm message to call me with my name and phone number.   Telephone Outgoing Call  Treatment Phase Pre-Tx/Tx Discussion  Barriers/Navigation Needs Coordination of Care  Interventions Coordination of Care  Coordination of Care Appts  Acuity Level 1  Acuity Level 1 Minimal follow up required  Time Spent with Patient 15

## 2016-09-29 ENCOUNTER — Ambulatory Visit: Payer: Medicare HMO | Admitting: Radiation Oncology

## 2016-09-29 ENCOUNTER — Other Ambulatory Visit: Payer: Self-pay | Admitting: Medical Oncology

## 2016-09-29 ENCOUNTER — Ambulatory Visit: Payer: Medicare HMO

## 2016-09-29 DIAGNOSIS — C55 Malignant neoplasm of uterus, part unspecified: Secondary | ICD-10-CM

## 2016-09-30 ENCOUNTER — Encounter: Payer: Self-pay | Admitting: Internal Medicine

## 2016-09-30 ENCOUNTER — Ambulatory Visit (HOSPITAL_BASED_OUTPATIENT_CLINIC_OR_DEPARTMENT_OTHER): Payer: Medicare HMO | Admitting: Internal Medicine

## 2016-09-30 ENCOUNTER — Other Ambulatory Visit (HOSPITAL_BASED_OUTPATIENT_CLINIC_OR_DEPARTMENT_OTHER): Payer: Medicare HMO

## 2016-09-30 ENCOUNTER — Encounter: Payer: Self-pay | Admitting: *Deleted

## 2016-09-30 ENCOUNTER — Ambulatory Visit: Payer: Medicare HMO | Admitting: Radiation Oncology

## 2016-09-30 ENCOUNTER — Encounter: Payer: Self-pay | Admitting: Radiation Oncology

## 2016-09-30 DIAGNOSIS — C7931 Secondary malignant neoplasm of brain: Secondary | ICD-10-CM | POA: Insufficient documentation

## 2016-09-30 DIAGNOSIS — I4891 Unspecified atrial fibrillation: Secondary | ICD-10-CM | POA: Diagnosis not present

## 2016-09-30 DIAGNOSIS — R58 Hemorrhage, not elsewhere classified: Secondary | ICD-10-CM

## 2016-09-30 DIAGNOSIS — I10 Essential (primary) hypertension: Secondary | ICD-10-CM

## 2016-09-30 DIAGNOSIS — Z8052 Family history of malignant neoplasm of bladder: Secondary | ICD-10-CM

## 2016-09-30 DIAGNOSIS — C55 Malignant neoplasm of uterus, part unspecified: Secondary | ICD-10-CM

## 2016-09-30 DIAGNOSIS — Z8542 Personal history of malignant neoplasm of other parts of uterus: Secondary | ICD-10-CM

## 2016-09-30 DIAGNOSIS — Z85828 Personal history of other malignant neoplasm of skin: Secondary | ICD-10-CM

## 2016-09-30 DIAGNOSIS — C3431 Malignant neoplasm of lower lobe, right bronchus or lung: Secondary | ICD-10-CM

## 2016-09-30 DIAGNOSIS — R52 Pain, unspecified: Secondary | ICD-10-CM

## 2016-09-30 DIAGNOSIS — E279 Disorder of adrenal gland, unspecified: Secondary | ICD-10-CM

## 2016-09-30 DIAGNOSIS — C3491 Malignant neoplasm of unspecified part of right bronchus or lung: Secondary | ICD-10-CM

## 2016-09-30 DIAGNOSIS — Z800 Family history of malignant neoplasm of digestive organs: Secondary | ICD-10-CM

## 2016-09-30 DIAGNOSIS — Z87891 Personal history of nicotine dependence: Secondary | ICD-10-CM

## 2016-09-30 HISTORY — DX: Malignant neoplasm of unspecified part of right bronchus or lung: C34.91

## 2016-09-30 LAB — COMPREHENSIVE METABOLIC PANEL
ALT: 19 U/L (ref 0–55)
AST: 18 U/L (ref 5–34)
Albumin: 3.1 g/dL — ABNORMAL LOW (ref 3.5–5.0)
Alkaline Phosphatase: 90 U/L (ref 40–150)
Anion Gap: 12 mEq/L — ABNORMAL HIGH (ref 3–11)
BUN: 32 mg/dL — ABNORMAL HIGH (ref 7.0–26.0)
CHLORIDE: 99 meq/L (ref 98–109)
CO2: 24 meq/L (ref 22–29)
Calcium: 8.7 mg/dL (ref 8.4–10.4)
Creatinine: 0.8 mg/dL (ref 0.6–1.1)
EGFR: 67 mL/min/{1.73_m2} — AB (ref >90)
Glucose: 126 mg/dl (ref 70–140)
Potassium: 4 mEq/L (ref 3.5–5.1)
Sodium: 134 mEq/L — ABNORMAL LOW (ref 136–145)
Total Bilirubin: 1.03 mg/dL (ref 0.20–1.20)
Total Protein: 5.5 g/dL — ABNORMAL LOW (ref 6.4–8.3)

## 2016-09-30 LAB — CBC WITH DIFFERENTIAL/PLATELET
BASO%: 0.3 % (ref 0.0–2.0)
BASOS ABS: 0 10*3/uL (ref 0.0–0.1)
EOS ABS: 0 10*3/uL (ref 0.0–0.5)
EOS%: 0 % (ref 0.0–7.0)
HCT: 50.3 % — ABNORMAL HIGH (ref 34.8–46.6)
HGB: 16.3 g/dL — ABNORMAL HIGH (ref 11.6–15.9)
LYMPH%: 3.7 % — ABNORMAL LOW (ref 14.0–49.7)
MCH: 26.7 pg (ref 25.1–34.0)
MCHC: 32.4 g/dL (ref 31.5–36.0)
MCV: 82.5 fL (ref 79.5–101.0)
MONO#: 0.8 10*3/uL (ref 0.1–0.9)
MONO%: 5 % (ref 0.0–14.0)
NEUT#: 14.8 10*3/uL — ABNORMAL HIGH (ref 1.5–6.5)
NEUT%: 91 % — AB (ref 38.4–76.8)
PLATELETS: 130 10*3/uL — AB (ref 145–400)
RBC: 6.09 10*6/uL — AB (ref 3.70–5.45)
RDW: 15.8 % — ABNORMAL HIGH (ref 11.2–14.5)
WBC: 16.3 10*3/uL — ABNORMAL HIGH (ref 3.9–10.3)
lymph#: 0.6 10*3/uL — ABNORMAL LOW (ref 0.9–3.3)

## 2016-09-30 NOTE — Progress Notes (Signed)
[place] Telephone:([phone]) [phone]   Fax:(336) [phone] Multidisciplinary thoracic oncology clinic  CONSULT NOTE  REFERRING PHYSICIAN: Dr. Debbe Odea  REASON FOR CONSULTATION:  83 years old white female recently diagnosed with lung cancer.  HPI [name] is a 83 y.o. female was past medical history significant for hypertension, atrial fibrillation on Xarelto, uterine cancer status post hysterectomy, history of squamous cell skin cancer, coronary artery disease and macular degeneration. The patient lives in independent living facility at [facility]. The patient mentions that she was walking back to her apartment when she felt a tingling in the right arm with dizzy spells. She lost consciousness and fell to the floor. She hit the right side of her face and had significant bleeding and ecchymosis in that area. She presented to the emergency department at [facility] on 09/13/2016 and she had CT scan of the head, maxillofacial as well as cervical spines. The scans showed left frontal white matter vasogenic edema was suspected underlying mass/metastatic disease. There was a small left frontotemporal subdural hematoma as well as anterior parafalcine subdural hemorrhage. There was no midline shift. There was nondisplaced comminuted appearing fracture of the right occipital condyle and nondisplaced fracture of the right posterior arch of C1. There was also tabs 3 fracture of the odontoid process is with 2 mm posterior displacement. MRI of the brain on 09/15/2016 showed peripherally contrast-enhancing mass centered within the left precentral gyrus measuring approximately 2.0 cm. There was surrounding hyperintense T2 weighted signal indicative of vasogenic edema versus nonenhancing tumor. There was no other intracranial lesions. CT scan of the chest, abdomen and pelvis were performed on 09/16/2016 and it showed 5.1 x 3.5 x 3.9 cm right lower lobe mass was borderline enlarged  right hilar lymph node highly concerning for primary bronchogenic neoplasm. There were also multiple indeterminant adrenal lesions in the right kidney. On 09/17/2016 the patient underwent CT-guided core biopsy of the right lower lobe lung mass by interventional radiology. The final pathology [id number]) was consistent with adenocarcinoma. The malignant cells were positive for TTF-1 1 and negative for Napsin A and cytokeratin 5/6. The patient was referred to the multidisciplinary thoracic oncology clinic today for further evaluation and recommendation regarding treatment of her condition. When seen today she continues to have significant ecchymosis of the left side of the face and head. She is wearing a neck collar. She complains about being uncomfortable during sleep because of her neck collar. She denied having any chest pain but has shortness breath with exertion with no cough or hemoptysis. She has occasional headache. She denied having any nausea, vomiting, diarrhea or constipation. Family history significant for father died from heart disease and mother from stroke. She has a brother who had stomach and bladder cancer and another brother with pancreatic cancer. The patient is single and has no children. She used to work as a Animal nutritionist she has a history of smoking less than one pack per day for around 20 years but quit in 1976. She has no history of alcohol or drug abuse.  HPI  Past Medical History:  Diagnosis Date  . A-fib ([place])   . Adenocarcinoma of right lung, stage 4 ([place]) 09/30/2016  . Coronary artery disease   . Hypertension   . MI (myocardial infarction)   . Skin cancer   . Subdural hematoma ([place]) 09/13/2016  . Uterine cancer [facility])     Past Surgical History:  Procedure Laterality Date  . ABDOMINAL HYSTERECTOMY    .  APPENDECTOMY    . CORONARY ANGIOPLASTY WITH STENT PLACEMENT    . REPLACEMENT TOTAL KNEE    . TONSILLECTOMY      History reviewed. No pertinent family  history.  Social History Social History  Substance Use Topics  . Smoking status: Never Smoker  . Smokeless tobacco: Never Used  . Alcohol use No    Allergies  Allergen Reactions  . Codeine Other (See Comments)    Possibly hives  . Dabigatran Hives    Reaction to Pradaxa  . Iodine Hives    Reaction to topical iodine  . Penicillins Hives    Has patient had a PCN reaction causing immediate rash, facial/tongue/throat swelling, SOB or lightheadedness with hypotension: Yes Has patient had a PCN reaction causing severe rash involving mucus membranes or skin necrosis: No Has patient had a PCN reaction that required hospitalization No Has patient had a PCN reaction occurring within the last 10 years: No If all of the above answers are "NO", then may proceed with Cephalosporin use.  . Sulfa Antibiotics Nausea And Vomiting    Current Outpatient Prescriptions  Medication Sig Dispense Refill  . acetaminophen (TYLENOL) 325 MG tablet Take 325-650 mg by mouth every 6 (six) hours as needed (pain).     . Ascorbic Acid (VITAMIN C) 1000 MG tablet Take 1,000 mg by mouth daily.    Marland Kitchen atorvastatin (LIPITOR) 40 MG tablet Take 40 mg by mouth at bedtime.     . beta carotene w/minerals (OCUVITE) tablet Take 2 tablets by mouth daily.    . Calcium Carb-Cholecalciferol (CALCIUM 1000 + D PO) Take 1,000 mg by mouth daily.    Marland Kitchen dexamethasone (DECADRON) 2 MG tablet Take 1 tablet (2 mg total) by mouth every 8 (eight) hours.    Marland Kitchen diltiazem (TIAZAC) 240 MG 24 hr capsule Take 240 mg by mouth daily.     Marland Kitchen ezetimibe (ZETIA) 10 MG tablet Take 10 mg by mouth at bedtime.     . levETIRAcetam (KEPPRA) 500 MG tablet Take 1 tablet (500 mg total) by mouth 2 (two) times daily. 60 tablet 2  . metoprolol succinate (TOPROL-XL) 50 MG 24 hr tablet Take 50 mg by mouth daily.     . mirabegron ER (MYRBETRIQ) 50 MG TB24 tablet Take 50 mg by mouth daily.    Marland Kitchen omeprazole (PRILOSEC) 40 MG capsule Take 40 mg by mouth daily.    Marland Kitchen  oxyCODONE-acetaminophen (PERCOCET/ROXICET) 5-325 MG tablet Take 1 tablet by mouth every 4 (four) hours as needed for moderate pain. 10 tablet 0  . Propylene Glycol-Glycerin (SOOTHE OP) Place 1 drop into both eyes daily.    . Zinc 50 MG TABS Take 50 mg by mouth daily.    . bisacodyl (DULCOLAX) 10 MG suppository Place 1 suppository (10 mg total) rectally as needed for moderate constipation. (Patient not taking: Reported on 09/30/2016) 12 suppository 0  . polyethylene glycol (MIRALAX) packet Take 17 g by mouth daily as needed for mild constipation. (Patient not taking: Reported on 09/30/2016) 14 each 0   No current facility-administered medications for this visit.     Review of Systems  Constitutional: positive for fatigue Eyes: negative Ears, nose, mouth, throat, and face: positive for Significant ecchymosis of the face and head Respiratory: positive for dyspnea on exertion Cardiovascular: negative Gastrointestinal: negative Genitourinary:negative Integument/breast: negative Hematologic/lymphatic: negative Musculoskeletal:negative Neurological: positive for headaches Behavioral/Psych: negative Endocrine: negative Allergic/Immunologic: negative  Physical Exam  WVP:XTGGY, healthy, no distress, well nourished and well developed SKIN: Significant ecchymosis on  the face and head. HEAD: Significant ecchymosis on the face and head. EYES: normal, PERRLA EARS: External ears normal, Canals clear OROPHARYNX:no exudate, no erythema and lips, buccal mucosa, and tongue normal  NECK: supple, no adenopathy, no JVD LYMPH:  no palpable lymphadenopathy, no hepatosplenomegaly BREAST:not examined LUNGS: clear to auscultation , and palpation HEART: regular rate & rhythm, no murmurs and no gallops ABDOMEN:abdomen soft, non-tender, normal bowel sounds and no masses or organomegaly BACK: Back symmetric, no curvature., No CVA tenderness EXTREMITIES:no joint deformities, effusion, or inflammation, no edema,  no skin discoloration  NEURO: alert & oriented x 3 with fluent speech, no focal motor/sensory deficits  PERFORMANCE STATUS: ECOG 1  LABORATORY DATA: Lab Results  Component Value Date   WBC 16.3 (H) 09/30/2016   HGB 16.3 (H) 09/30/2016   HCT 50.3 (H) 09/30/2016   MCV 82.5 09/30/2016   PLT 130 (L) 09/30/2016      Chemistry      Component Value Date/Time   NA 134 (L) 09/30/2016 1357   K 4.0 09/30/2016 1357   CL 94 (L) 09/18/2016 0555   CO2 24 09/30/2016 1357   BUN 32.0 (H) 09/30/2016 1357   CREATININE 0.8 09/30/2016 1357      Component Value Date/Time   CALCIUM 8.7 09/30/2016 1357   ALKPHOS 90 09/30/2016 1357   AST 18 09/30/2016 1357   ALT 19 09/30/2016 1357   BILITOT 1.03 09/30/2016 1357       RADIOGRAPHIC STUDIES: Dg Chest 2 View  Result Date: 09/13/2016 CLINICAL DATA:  Initial evaluation for acute dizziness, fall. EXAM: CHEST  2 VIEW COMPARISON:  Prior radiograph from 01/01/2009. FINDINGS: Moderate cardiomegaly, slightly progressed relative to most recent radiograph from 2010. Mediastinal silhouette within normal limits. Aortic atherosclerosis, also slightly progressed. Lungs normally inflated. Mild diffuse interstitial prominence, suggesting mild interstitial edema. No focal infiltrates. No significant pleural effusion. No pneumothorax. No acute osseous abnormality. IMPRESSION: 1. Cardiomegaly with mild diffuse interstitial congestion/edema. Cardiomegaly has mildly progressed relative to most recent radiograph from 01/01/2009. 2. No other active cardiopulmonary disease. 3. Aortic atherosclerosis, also progressed relative to 2010. Electronically Signed   By: Jeannine Boga M.D.   On: 09/13/2016 21:40   Dg Tibia/fibula Right  Result Date: 09/13/2016 CLINICAL DATA:  Initial evaluation for acute trauma, fall. EXAM: RIGHT TIBIA AND FIBULA - 2 VIEW COMPARISON:  None. FINDINGS: There is no evidence of fracture or other focal bone lesions. So no acute soft tissue  abnormality about the leg scattered atheromatous vascular calcifications noted posterior to the knee. Advanced degenerative changes noted about the partially visualized knee. IMPRESSION: 1. No acute osseous abnormality about the right tibia/fibula. 2. Advanced degenerative osteoarthrosis about the right knee. 3. Atherosclerotic disease. Electronically Signed   By: Jeannine Boga M.D.   On: 09/13/2016 21:42   Ct Head Wo Contrast  Result Date: 09/13/2016 CLINICAL DATA:  83 year old female with dizziness and fall. History of breast cancer. EXAM: CT HEAD WITHOUT CONTRAST CT MAXILLOFACIAL WITHOUT CONTRAST CT CERVICAL SPINE WITHOUT CONTRAST TECHNIQUE: Multidetector CT imaging of the head, cervical spine, and maxillofacial structures were performed using the standard protocol without intravenous contrast. Multiplanar CT image reconstructions of the cervical spine and maxillofacial structures were also generated. COMPARISON:  Head CT dated 02/24/2009 FINDINGS: CT HEAD FINDINGS Brain: There is a left frontotemporal subdural hemorrhage measuring up to 4 mm in thickness. Right anterior parafalcine subdural hemorrhage measures 4 mm in thickness. An area of white matter edema noted over the left frontal convexity compatible with vasogenic edema.  There is mild mass effect and effacement of the adjacent sulci. A 1.2 x 0.9 cm density in the left frontal convexity (series 2, image 23 and sagittal series 5 image 41) is concerning for a primary neoplasm versus metastatic disease. Further evaluation with MRI without and with contrast is recommended. There is mild age-related atrophy and chronic microvascular ischemic changes. No midline shift noted. The quadrigeminal plate cistern is patent. Vascular: No hyperdense vessel or unexpected calcification. Skull: Nondisplaced fracture of the right occipital condyle. No other calcaneal fracture. Other: Large left forehead hematoma. CT MAXILLOFACIAL FINDINGS Osseous: No fracture or  mandibular dislocation. No destructive process. Chronic degenerative changes at the left TMJ. Orbits: Bilateral cataract surgeries noted. The globes and retro-orbital fat are preserved. Sinuses: Clear. Soft tissues: Left forehead hematoma. CT CERVICAL SPINE FINDINGS Alignment: No acute subluxation. Skull base and vertebrae: There is nondisplaced comminuted appearing fracture of the right occipital condyle. There is nondisplaced fracture of the right posterior arch of C1. There is incomplete bony fusion of the posterior ring of C1. There is type III fracture of the base of the odontoid process with approximately 2 mm posterior displacement of the tip of the odontoid. Evaluation for fracture is very limited due to advanced osteopenia. There is apparent ankylosis of the C2-C5 posterior elements related to chronic changes and inflammation. Soft tissues and spinal canal: No prevertebral fluid or swelling. No visible canal hematoma. Disc levels:  Multilevel degenerative changes. Upper chest: Bilateral carotid bulb atherosclerotic disease. Other: None IMPRESSION: Left frontal white matter vasogenic edema with suspected underlying mass/metastatic disease. MRI without and with contrast is recommended for further evaluation. Small left frontotemporal subdural hemorrhage as well as anterior parafalcine subdural hemorrhage. No midline shift. Nondisplaced comminuted appearing fracture of the right occipital condyle. Nondisplaced fracture of the right posterior arch of C1. Type III fracture of the odontoid process with 2 mm posterior displacement. These results were called by telephone at the time of interpretation on 09/13/2016 at 9:42 pm to Dr. Davonna Belling , who verbally acknowledged these results. Electronically Signed   By: Anner Crete M.D.   On: 09/13/2016 21:48   Ct Chest W Contrast  Result Date: 09/16/2016 CLINICAL DATA:  83 year old female with history of brain tumor, with recent dizziness and history of  fall yesterday. Multiple cervical spine fractures. EXAM: CT CHEST, ABDOMEN, AND PELVIS WITH CONTRAST TECHNIQUE: Multidetector CT imaging of the chest, abdomen and pelvis was performed following the standard protocol during bolus administration of intravenous contrast. CONTRAST:  1 ISOVUE-300 IOPAMIDOL (ISOVUE-300) INJECTION 61% COMPARISON:  No priors. FINDINGS: CT CHEST FINDINGS Cardiovascular: Heart size is mildly enlarged. Trace amount of pericardial fluid and/or thickening adjacent to the left ventricle, unlikely to be of any hemodynamic significance at this time. Small amount of pericardial calcification overlying the right atrioventricular groove inferiorly (image 47 of series 2). There is aortic atherosclerosis, as well as atherosclerosis of the great vessels of the mediastinum and the coronary arteries, including calcified atherosclerotic plaque in the left main, left anterior descending, left circumflex and right coronary arteries. Mediastinum/Nodes: Borderline enlarged 9 mm short axis right hilar lymph node (axial image 25 of series 2). No other mediastinal or left hilar lymphadenopathy is noted. Esophagus is unremarkable in appearance. No axillary lymphadenopathy. Lungs/Pleura: In the mid right lower lobe there is a 5.1 x 3.5 x 3.9 cm macrolobulated mass which makes contact with the overlying pleura (image 96 of series 3 and coronal image 109 of series 5). Most of the basal segmental bronchi  to the right lower lobe appear occluded, either with soft tissue wall or retained secretions. Mild postobstructive changes are noted distal to the mass in the posterior basal aspect of the right lower lobe, but the right lower lobe is otherwise relatively well aerated. 6 mm nodule in the lateral segment of the right middle lobe (image 85 of series 3). A few other scattered 1-3 mm pulmonary nodules are noted throughout the lungs bilaterally, highly nonspecific. No acute consolidative airspace disease. No pleural  effusions. Musculoskeletal: No acute displaced fractures or aggressive appearing lytic or blastic lesions are noted in the visualized portions of the skeleton. CT ABDOMEN PELVIS FINDINGS Hepatobiliary: No cystic or solid hepatic lesions are noted. No intra or extrahepatic biliary ductal dilatation. There is amorphous high attenuation material lying dependently in the gallbladder, compatible with biliary sludge (although some of this may reflect vicarious excretion of gadolinium related to recent MRI examination). No findings to suggest an acute cholecystitis at this time. Pancreas: No pancreatic mass. No pancreatic ductal dilatation. No pancreatic or peripancreatic fluid or inflammatory changes. Spleen: In the medial aspect of the spleen there is a 1.8 cm hypervascular lesions which appears to normalize to the remainder of splenic parenchyma on delayed images, possibly indicative of a hemangioma. Adrenals/Urinary Tract: In the anterior aspect of the interpolar region of the left kidney there is a a 2.1 cm simple cyst. Multiple other subcentimeter low-attenuation lesions are noted in the kidneys bilaterally, too small to characterize, but favored to represent cysts. In addition, however, there are multiple intermediate to high attenuation lesions in both kidneys which are incompletely characterized on today's examination. Several of these demonstrate no appreciable change in attenuation between portal venous phase and delayed images, favored to represent proteinaceous/hemorrhagic cysts, but overall incompletely characterized without noncontrast images. The largest of these lesions measure up to 2.2 cm in the lower pole of the right kidney. No hydroureteronephrosis. Urinary bladder is normal in appearance. Stomach/Bowel: The stomach is nearly decompressed, but otherwise unremarkable in appearance. There is no pathologic dilatation of small bowel or colon. Several colonic diverticulae are noted, without definite  surrounding inflammatory changes to suggest an acute diverticulitis at this time. The appendix is not confidently identified and may be surgically absent. Regardless, there are no inflammatory changes noted adjacent to the cecum to suggest the presence of an acute appendicitis at this time. Vascular/Lymphatic: Aortic atherosclerosis, without aneurysm or dissection in the abdominal or pelvic vasculature. No lymphadenopathy is noted in the abdomen or pelvis. Reproductive: Status post hysterectomy. Ovaries are not confidently identified may be surgically absent or atrophic. Other: No significant volume of ascites.  No pneumoperitoneum. Musculoskeletal: No acute displaced fractures in the visualized portions of the skeleton. Chronic appearing compression fracture of L3 with approximately 60% loss of central vertebral body height. There are no aggressive appearing lytic or blastic lesions noted in the visualized portions of the skeleton. Well-circumscribed low-attenuation lesion in the subcutaneous fat of the left lumbar region measuring 1.9 cm in diameter (image 64 of series 2) is nonspecific, but likely a sebaceous cyst. IMPRESSION: 1. No signs of significant acute traumatic injury to the chest, abdomen or pelvis. 2. 5.1 x 3.5 x 3.9 cm right lower lobe mass with borderline enlarged right hilar lymph node, highly concerning for primary bronchogenic neoplasm. Further evaluation with PET-CT and/or biopsy is recommended in the near future for diagnostic and staging purposes. 3. Multiple indeterminate renal lesions in the right kidney. Although these are favored to represent proteinaceous/hemorrhagic cysts, further evaluation  with MRI of the abdomen with and without IV gadolinium could provide characterization if clinically appropriate. 4. Colonic diverticulosis without evidence of acute diverticulitis at this time. 5. Aortic atherosclerosis, in addition to left main and 3 vessel coronary artery disease. 6. Additional  incidental findings, as above. Electronically Signed   By: Vinnie Langton M.D.   On: 09/16/2016 15:35   Ct Cervical Spine Wo Contrast  Result Date: 09/13/2016 CLINICAL DATA:  83 year old female with dizziness and fall. History of breast cancer. EXAM: CT HEAD WITHOUT CONTRAST CT MAXILLOFACIAL WITHOUT CONTRAST CT CERVICAL SPINE WITHOUT CONTRAST TECHNIQUE: Multidetector CT imaging of the head, cervical spine, and maxillofacial structures were performed using the standard protocol without intravenous contrast. Multiplanar CT image reconstructions of the cervical spine and maxillofacial structures were also generated. COMPARISON:  Head CT dated 02/24/2009 FINDINGS: CT HEAD FINDINGS Brain: There is a left frontotemporal subdural hemorrhage measuring up to 4 mm in thickness. Right anterior parafalcine subdural hemorrhage measures 4 mm in thickness. An area of white matter edema noted over the left frontal convexity compatible with vasogenic edema. There is mild mass effect and effacement of the adjacent sulci. A 1.2 x 0.9 cm density in the left frontal convexity (series 2, image 23 and sagittal series 5 image 41) is concerning for a primary neoplasm versus metastatic disease. Further evaluation with MRI without and with contrast is recommended. There is mild age-related atrophy and chronic microvascular ischemic changes. No midline shift noted. The quadrigeminal plate cistern is patent. Vascular: No hyperdense vessel or unexpected calcification. Skull: Nondisplaced fracture of the right occipital condyle. No other calcaneal fracture. Other: Large left forehead hematoma. CT MAXILLOFACIAL FINDINGS Osseous: No fracture or mandibular dislocation. No destructive process. Chronic degenerative changes at the left TMJ. Orbits: Bilateral cataract surgeries noted. The globes and retro-orbital fat are preserved. Sinuses: Clear. Soft tissues: Left forehead hematoma. CT CERVICAL SPINE FINDINGS Alignment: No acute subluxation.  Skull base and vertebrae: There is nondisplaced comminuted appearing fracture of the right occipital condyle. There is nondisplaced fracture of the right posterior arch of C1. There is incomplete bony fusion of the posterior ring of C1. There is type III fracture of the base of the odontoid process with approximately 2 mm posterior displacement of the tip of the odontoid. Evaluation for fracture is very limited due to advanced osteopenia. There is apparent ankylosis of the C2-C5 posterior elements related to chronic changes and inflammation. Soft tissues and spinal canal: No prevertebral fluid or swelling. No visible canal hematoma. Disc levels:  Multilevel degenerative changes. Upper chest: Bilateral carotid bulb atherosclerotic disease. Other: None IMPRESSION: Left frontal white matter vasogenic edema with suspected underlying mass/metastatic disease. MRI without and with contrast is recommended for further evaluation. Small left frontotemporal subdural hemorrhage as well as anterior parafalcine subdural hemorrhage. No midline shift. Nondisplaced comminuted appearing fracture of the right occipital condyle. Nondisplaced fracture of the right posterior arch of C1. Type III fracture of the odontoid process with 2 mm posterior displacement. These results were called by telephone at the time of interpretation on 09/13/2016 at 9:42 pm to Dr. Davonna Belling , who verbally acknowledged these results. Electronically Signed   By: Anner Crete M.D.   On: 09/13/2016 21:48   Mr Jeri Cos JK Contrast  Result Date: 09/15/2016 CLINICAL DATA:  Preoperative planning for brain tumor resection. EXAM: MRI HEAD WITHOUT AND WITH CONTRAST TECHNIQUE: Multiplanar, multiecho pulse sequences of the brain and surrounding structures were obtained without and with intravenous contrast. CONTRAST:  61m MULTIHANCE GADOBENATE  DIMEGLUMINE 529 MG/ML IV SOLN COMPARISON:  Brain MRI 09/14/2016 FINDINGS: Brain: Left convexity subdural hematoma  has undergone redistribution, now primarily adjacent to left parietal lobe, measuring 5 mm. There is multifocal hyperintense T2-weighted signal within the periventricular white matter, most often seen in the setting of chronic microvascular ischemia. Centered within the left precentral gyrus is a peripherally contrast-enhancing mass that measures 1.9 x 1.8 cm. There is surrounding hyperintense T2 weighted signal that extends anteriorly within the left frontal white matter and posteriorly into the left postcentral gyrus. There is no hydrocephalus or midline shift. No age advanced or lobar predominant atrophy. Vascular: Major intracranial arterial and venous sinus flow voids are preserved. No evidence of chronic microhemorrhage or amyloid angiopathy. Skull and upper cervical spine: Known fractures of the odontoid and left occipital condyles are better characterized on recent CT. Large left frontal scalp subgaleal hematoma is again noted. Extensive lateral left scalp soft tissue swelling also present. Sinuses/Orbits: No fluid levels or advanced mucosal thickening. No mastoid effusion. Normal orbits. IMPRESSION: 1. Peripherally contrast-enhancing mass centered within the left precentral gyrus, measuring approximately 2 cm. Surrounding hyperintense T2 weighted signal may indicate vasogenic edema versus non enhancing tumor. 2. No other intracranial lesions. 3. Redistribution of left subdural hematoma without midline shift or hydrocephalus. 4. Odontoid and occipital condyle fractures are better characterized on recent CT. Electronically Signed   By: Ulyses Jarred M.D.   On: 09/15/2016 20:22   Mr Jeri Cos XH Contrast  Result Date: 09/14/2016 CLINICAL DATA:  Initial evaluation for brain mass. EXAM: MRI HEAD WITHOUT AND WITH CONTRAST TECHNIQUE: Multiplanar, multiecho pulse sequences of the brain and surrounding structures were obtained without and with intravenous contrast. CONTRAST:  52m MULTIHANCE GADOBENATE  DIMEGLUMINE 529 MG/ML IV SOLN COMPARISON:  Prior CT from 09/13/2016. FINDINGS: Brain: Study degraded by motion artifact. Cerebral volume within normal limits for age. Patchy T2/FLAIR hyperintensity within the periventricular and deep white matter both cerebral hemispheres most consistent with chronic small vessel ischemic disease, mild in nature. There is a heterogeneous enhancing mass centered at the left frontal parietal convexity that measures 21 x 22 x 16 mm. Associated vasogenic edema within the left frontotemporal region without significant mass effect. No other mass lesion or abnormal enhancement identified. Differential considerations include possible primary CNS neoplasm or solitary metastasis. Diffusion-weighted imaging demonstrates no evidence for acute ischemic infarct. Acute subdural hematoma overlying the left frontotemporal convexity again noted. This measures up to 5 mm in maximal thickness. No midline shift. No hydrocephalus. Incidental note made of a partially empty sella. Midline structures intact. Vascular: Major intracranial vascular flow voids are preserved. Skull and upper cervical spine: Acute odontoid fracture noted, better evaluated on prior CT. Right occipital condyle fracture also better visualized on CT. Craniocervical junction remains widely patent. Visualized upper cervical spine within normal limits without acute injury. Large hematoma at the left frontal scalp with associated swelling. Sinuses/Orbits: Globes intact. No definite retro-orbital pathology. Mild left-sided proptosis. Patient is status post lens extraction. Paranasal sinuses are clear. No mastoid effusion. Inner ear structures normal. IMPRESSION: 1. 21 x 22 x 16 mm heterogeneous Lee enhancing mass at the left frontal parietal convexity with associated vasogenic edema. No other intracranial mass lesion identified. Finding may reflect a solitary intracranial metastasis or primary CNS neoplasm. 2. Acute left frontotemporal  subdural hematoma measuring up to 5 mm without significant mass effect. No midline shift. 3. Acute odontoid and right occipital condyle fractures, better evaluated on prior CT. Craniocervical juncture remains widely patent, with no  MRI evidence for acute injury to the visualized upper cervical spinal cord. 4. Large hematoma at the left frontal scalp. Electronically Signed   By: Jeannine Boga M.D.   On: 09/14/2016 03:55   Ct Abdomen Pelvis W Contrast  Result Date: 09/16/2016 CLINICAL DATA:  83 year old female with history of brain tumor, with recent dizziness and history of fall yesterday. Multiple cervical spine fractures. EXAM: CT CHEST, ABDOMEN, AND PELVIS WITH CONTRAST TECHNIQUE: Multidetector CT imaging of the chest, abdomen and pelvis was performed following the standard protocol during bolus administration of intravenous contrast. CONTRAST:  1 ISOVUE-300 IOPAMIDOL (ISOVUE-300) INJECTION 61% COMPARISON:  No priors. FINDINGS: CT CHEST FINDINGS Cardiovascular: Heart size is mildly enlarged. Trace amount of pericardial fluid and/or thickening adjacent to the left ventricle, unlikely to be of any hemodynamic significance at this time. Small amount of pericardial calcification overlying the right atrioventricular groove inferiorly (image 47 of series 2). There is aortic atherosclerosis, as well as atherosclerosis of the great vessels of the mediastinum and the coronary arteries, including calcified atherosclerotic plaque in the left main, left anterior descending, left circumflex and right coronary arteries. Mediastinum/Nodes: Borderline enlarged 9 mm short axis right hilar lymph node (axial image 25 of series 2). No other mediastinal or left hilar lymphadenopathy is noted. Esophagus is unremarkable in appearance. No axillary lymphadenopathy. Lungs/Pleura: In the mid right lower lobe there is a 5.1 x 3.5 x 3.9 cm macrolobulated mass which makes contact with the overlying pleura (image 96 of series 3 and  coronal image 109 of series 5). Most of the basal segmental bronchi to the right lower lobe appear occluded, either with soft tissue wall or retained secretions. Mild postobstructive changes are noted distal to the mass in the posterior basal aspect of the right lower lobe, but the right lower lobe is otherwise relatively well aerated. 6 mm nodule in the lateral segment of the right middle lobe (image 85 of series 3). A few other scattered 1-3 mm pulmonary nodules are noted throughout the lungs bilaterally, highly nonspecific. No acute consolidative airspace disease. No pleural effusions. Musculoskeletal: No acute displaced fractures or aggressive appearing lytic or blastic lesions are noted in the visualized portions of the skeleton. CT ABDOMEN PELVIS FINDINGS Hepatobiliary: No cystic or solid hepatic lesions are noted. No intra or extrahepatic biliary ductal dilatation. There is amorphous high attenuation material lying dependently in the gallbladder, compatible with biliary sludge (although some of this may reflect vicarious excretion of gadolinium related to recent MRI examination). No findings to suggest an acute cholecystitis at this time. Pancreas: No pancreatic mass. No pancreatic ductal dilatation. No pancreatic or peripancreatic fluid or inflammatory changes. Spleen: In the medial aspect of the spleen there is a 1.8 cm hypervascular lesions which appears to normalize to the remainder of splenic parenchyma on delayed images, possibly indicative of a hemangioma. Adrenals/Urinary Tract: In the anterior aspect of the interpolar region of the left kidney there is a a 2.1 cm simple cyst. Multiple other subcentimeter low-attenuation lesions are noted in the kidneys bilaterally, too small to characterize, but favored to represent cysts. In addition, however, there are multiple intermediate to high attenuation lesions in both kidneys which are incompletely characterized on today's examination. Several of these  demonstrate no appreciable change in attenuation between portal venous phase and delayed images, favored to represent proteinaceous/hemorrhagic cysts, but overall incompletely characterized without noncontrast images. The largest of these lesions measure up to 2.2 cm in the lower pole of the right kidney. No hydroureteronephrosis. Urinary bladder  is normal in appearance. Stomach/Bowel: The stomach is nearly decompressed, but otherwise unremarkable in appearance. There is no pathologic dilatation of small bowel or colon. Several colonic diverticulae are noted, without definite surrounding inflammatory changes to suggest an acute diverticulitis at this time. The appendix is not confidently identified and may be surgically absent. Regardless, there are no inflammatory changes noted adjacent to the cecum to suggest the presence of an acute appendicitis at this time. Vascular/Lymphatic: Aortic atherosclerosis, without aneurysm or dissection in the abdominal or pelvic vasculature. No lymphadenopathy is noted in the abdomen or pelvis. Reproductive: Status post hysterectomy. Ovaries are not confidently identified may be surgically absent or atrophic. Other: No significant volume of ascites.  No pneumoperitoneum. Musculoskeletal: No acute displaced fractures in the visualized portions of the skeleton. Chronic appearing compression fracture of L3 with approximately 60% loss of central vertebral body height. There are no aggressive appearing lytic or blastic lesions noted in the visualized portions of the skeleton. Well-circumscribed low-attenuation lesion in the subcutaneous fat of the left lumbar region measuring 1.9 cm in diameter (image 64 of series 2) is nonspecific, but likely a sebaceous cyst. IMPRESSION: 1. No signs of significant acute traumatic injury to the chest, abdomen or pelvis. 2. 5.1 x 3.5 x 3.9 cm right lower lobe mass with borderline enlarged right hilar lymph node, highly concerning for primary bronchogenic  neoplasm. Further evaluation with PET-CT and/or biopsy is recommended in the near future for diagnostic and staging purposes. 3. Multiple indeterminate renal lesions in the right kidney. Although these are favored to represent proteinaceous/hemorrhagic cysts, further evaluation with MRI of the abdomen with and without IV gadolinium could provide characterization if clinically appropriate. 4. Colonic diverticulosis without evidence of acute diverticulitis at this time. 5. Aortic atherosclerosis, in addition to left main and 3 vessel coronary artery disease. 6. Additional incidental findings, as above. Electronically Signed   By: Vinnie Langton M.D.   On: 09/16/2016 15:35   Ct Biopsy  Result Date: 09/17/2016 INDICATION: 83 year old female with a history of right lower lobe mass. EXAM: CT BIOPSY MEDICATIONS: None. ANESTHESIA/SEDATION: Moderate (conscious) sedation was employed during this procedure. A total of Versed 1.0 mg and Fentanyl 50 mcg was administered intravenously. Moderate Sedation Time: 10 minutes. The patient's level of consciousness and vital signs were monitored continuously by radiology nursing throughout the procedure under my direct supervision. FLUOROSCOPY TIME:  None COMPLICATIONS: None PROCEDURE: The procedure, risks, benefits, and alternatives were explained to the patient and the patient's family. Specific risks that were addressed included bleeding, infection, pneumothorax, need for further procedure including chest tube placement, chance of delayed pneumothorax or hemorrhage, hemoptysis, nondiagnostic sample, cardiopulmonary collapse, death. Questions regarding the procedure were encouraged and answered. The patient understands and consents to the procedure. Patient was positioned in the right acute position on the CT gantry table and a scout CT of the chest was performed for planning purposes. Once angle of approach was determined, the skin and subcutaneous tissues this scan was prepped  and draped in the usual sterile fashion, and a sterile drape was applied covering the operative field. A sterile gown and sterile gloves were used for the procedure. Local anesthesia was provided with 1% Lidocaine. The skin and subcutaneous tissues were infiltrated 1% lidocaine for local anesthesia, and a small stab incision was made with an 11 blade scalpel. Using CT guidance, a 17 gauge trocar needle was advanced into the right lower lobetarget. After confirmation of the tip, separate 18 gauge core biopsies were performed. These were  placed into solution for transportation to the lab. Biosentry Device was deployed. A final CT image was performed. Patient tolerated the procedure well and remained hemodynamically stable throughout. No complications were encountered and no significant blood loss was encounter IMPRESSION: Status post CT-guided biopsy of right lower lobe mass. Tissue specimen sent to pathology for complete histopathologic analysis. Signed, Dulcy Fanny. Earleen Newport, DO [facility] Electronically Signed   By: Corrie Mckusick D.O.   On: 09/17/2016 16:47   Dg Chest Port 1 View  Result Date: 09/17/2016 CLINICAL DATA:  Pneumothorax after biopsy. EXAM: PORTABLE CHEST 1 VIEW COMPARISON:  CT scan from earlier today. FINDINGS: Upright AP portable view of the chest shows no evidence for pneumothorax. Focal nodular opacity projecting over the right heart border is compatible with the patient's known right lower lobe lung mass. No pulmonary edema or substantial pleural effusion. The cardio pericardial silhouette is enlarged. The visualized bony structures of the thorax are intact. Telemetry leads overlie the chest. IMPRESSION: No evidence for pneumothorax. Electronically Signed   By: Misty Stanley M.D.   On: 09/17/2016 17:07   Ct Maxillofacial Wo Contrast  Result Date: 09/13/2016 CLINICAL DATA:  83 year old female with dizziness and fall. History of breast  cancer. EXAM: CT HEAD WITHOUT CONTRAST CT MAXILLOFACIAL WITHOUT CONTRAST CT CERVICAL SPINE WITHOUT CONTRAST TECHNIQUE: Multidetector CT imaging of the head, cervical spine, and maxillofacial structures were performed using the standard protocol without intravenous contrast. Multiplanar CT image reconstructions of the cervical spine and maxillofacial structures were also generated. COMPARISON:  Head CT dated 02/24/2009 FINDINGS: CT HEAD FINDINGS Brain: There is a left frontotemporal subdural hemorrhage measuring up to 4 mm in thickness. Right anterior parafalcine subdural hemorrhage measures 4 mm in thickness. An area of white matter edema noted over the left frontal convexity compatible with vasogenic edema. There is mild mass effect and effacement of the adjacent sulci. A 1.2 x 0.9 cm density in the left frontal convexity (series 2, image 23 and sagittal series 5 image 41) is concerning for a primary neoplasm versus metastatic disease. Further evaluation with MRI without and with contrast is recommended. There is mild age-related atrophy and chronic microvascular ischemic changes. No midline shift noted. The quadrigeminal plate cistern is patent. Vascular: No hyperdense vessel or unexpected calcification. Skull: Nondisplaced fracture of the right occipital condyle. No other calcaneal fracture. Other: Large left forehead hematoma. CT MAXILLOFACIAL FINDINGS Osseous: No fracture or mandibular dislocation. No destructive process. Chronic degenerative changes at the left TMJ. Orbits: Bilateral cataract surgeries noted. The globes and retro-orbital fat are preserved. Sinuses: Clear. Soft tissues: Left forehead hematoma. CT CERVICAL SPINE FINDINGS Alignment: No acute subluxation. Skull base and vertebrae: There is nondisplaced comminuted appearing fracture of the right occipital condyle. There is nondisplaced fracture of the right posterior arch of C1. There is incomplete bony fusion of the posterior ring of C1. There is  type III fracture of the base of the odontoid process with approximately 2 mm posterior displacement of the tip of the odontoid. Evaluation for fracture is very limited due to advanced osteopenia. There is apparent ankylosis of the C2-C5 posterior elements related to chronic changes and inflammation. Soft tissues and spinal canal: No prevertebral fluid or swelling. No visible canal hematoma. Disc levels:  Multilevel degenerative changes. Upper chest: Bilateral carotid bulb atherosclerotic disease. Other: None IMPRESSION: Left frontal white matter vasogenic edema with suspected underlying mass/metastatic disease. MRI without and with contrast is recommended for further evaluation. Small left frontotemporal subdural hemorrhage  as well as anterior parafalcine subdural hemorrhage. No midline shift. Nondisplaced comminuted appearing fracture of the right occipital condyle. Nondisplaced fracture of the right posterior arch of C1. Type III fracture of the odontoid process with 2 mm posterior displacement. These results were called by telephone at the time of interpretation on 09/13/2016 at 9:42 pm to Dr. Davonna Belling , who verbally acknowledged these results. Electronically Signed   By: Anner Crete M.D.   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   PLAN: I had a lengthy discussion with the patient and her friend today about her current disease stage, prognosis and treatment options. I recommended for the patient to complete the staging workup by ordering a PET scan to rule out any other metastatic disease. I also ask the pathologist to send her tissue block for molecular studies and PDL 1 expression. The patient has  appointment to see Dr. Tammi Klippel early next week for evaluation and consideration of stereotactic radiotherapy to the solitary brain metastasis. She will continue on the dexamethasone for the vasogenic edema. I explained to the patient that she has incurable condition and on the treatment will be of palliative nature. I discussed with her treatment options including palliative care versus consideration of palliative systemic chemotherapy versus waiting for the results of the molecular studies to see if the patient has any targetable mutations. The patient would like to wait until we have the results of the molecular studies which may take between 2-3 weeks. During this time she will be receiving stereotactic radiotherapy to the brain and she may also benefit from palliative radiotherapy to the right lower lobe lung mass. The patient was seen during the multidisciplinary thoracic oncology clinic today by medical oncology, thoracic navigator, social worker and physical therapist. For pain management the patient will continue on Percocet for now. She was advised to call immediately if she has any concerning symptoms in the interval. The patient voices understanding of current disease status and treatment options and is in agreement with the current care plan.  All questions were answered. The patient knows to call the clinic with any problems, questions or concerns. We can certainly see the patient much sooner if necessary.  Thank you so much for allowing me to participate in the care of [name]. I will continue to follow up the patient with you and assist in her care.  I spent 40 minutes counseling the patient face to face. The total time spent in the appointment was 60 minutes.  Disclaimer: This note was dictated with voice recognition software. Similar sounding words can inadvertently be transcribed and may not be corrected upon review.   Janan Bogie K. September 30, 2016, 3:15 PM

## 2016-09-30 NOTE — Progress Notes (Signed)
Oncology Nurse Navigator Documentation  Oncology Nurse Navigator Flowsheets 09/30/2016  Navigator Location [facility]  Navigator Encounter Type Clinic/MDC/spoke with patient and friend today at [facility].  Gave and explained information on lung cancer, resources and support services at [facility] and next steps. I also contacted pre cert coordinator to get PET authorized.    Abnormal Finding Date 09/14/2016  Confirmed Diagnosis Date 10/18/2016  Multidisiplinary Clinic Date 09/30/2016  Treatment Initiated Date 10/01/2016  Patient Visit Type MedOnc  Treatment Phase Pre-Tx/Tx Discussion  Barriers/Navigation Needs Coordination of Care;Education  Education Newly Diagnosed Cancer Education;Other  Interventions Coordination of Care;Education  Coordination of Care Other  Education Method Verbal;Written  Acuity Level 2  Acuity Level 2 Educational needs;Assistance expediting appointments  Time Spent with Patient 72

## 2016-09-30 NOTE — Progress Notes (Signed)
[place] Clinical Social Work  Clinical Social Work met with patient/family at [employer] appointment to offer support and assess for psychosocial needs.  Patient was accompanied by his cousin/close friend.  The patient recently experienced a fall, reported she discovered her cancer after her fall.  She has lived in [place], currently in the rehab area.  [name] shared she has no distress at this time and has a very positive attitude regarding her current situation.    Clinical Social Work briefly discussed Clinical Social Work role and [employer] support programs/services.  Clinical Social Work encouraged patient to call with any additional questions or concerns.   [name], MSW, LCSW, OSW-C Clinical Social Worker [facility] [phone]

## 2016-09-30 NOTE — Progress Notes (Signed)
Oncology Nurse Navigator Documentation  Oncology Nurse Navigator Flowsheets 09/30/2016  Navigator Location [facility]  Navigator Encounter Type Other/per cancer conference discussion and Dr. Julien Nordmann, I contacted [facility] to send tissue obtained on 09/17/16 for PDL 1 testing.   Treatment Phase Pre-Tx/Tx Discussion  Barriers/Navigation Needs Coordination of Care  Interventions Coordination of Care  Coordination of Care Other  Acuity Level 2  Acuity Level 2 Other  Time Spent with Patient 30

## 2016-10-01 ENCOUNTER — Other Ambulatory Visit: Payer: Self-pay | Admitting: *Deleted

## 2016-10-01 ENCOUNTER — Ambulatory Visit
Admission: RE | Admit: 2016-10-01 | Discharge: 2016-10-01 | Disposition: A | Discharge status: Home / Self Care | Payer: Medicare HMO | Source: Ambulatory Visit | Attending: Radiation Oncology | Admitting: Radiation Oncology

## 2016-10-01 DIAGNOSIS — C7949 Secondary malignant neoplasm of other parts of nervous system: Principal | ICD-10-CM

## 2016-10-01 DIAGNOSIS — C7931 Secondary malignant neoplasm of brain: Secondary | ICD-10-CM

## 2016-10-01 MED ORDER — GADOBENATE DIMEGLUMINE 529 MG/ML IV SOLN
17.0000 mL | Freq: Once | INTRAVENOUS | Status: AC | PRN
  Administered 2016-10-01: 17 mL via INTRAVENOUS

## 2016-10-04 ENCOUNTER — Ambulatory Visit
Admission: RE | Admit: 2016-10-04 | Discharge: 2016-10-04 | Disposition: A | Discharge status: Home / Self Care | Payer: Medicare HMO | Source: Ambulatory Visit | Attending: Radiation Oncology | Admitting: Radiation Oncology

## 2016-10-04 ENCOUNTER — Ambulatory Visit: Payer: Medicare HMO

## 2016-10-04 ENCOUNTER — Encounter: Payer: Self-pay | Admitting: Radiation Oncology

## 2016-10-04 ENCOUNTER — Other Ambulatory Visit (HOSPITAL_COMMUNITY)
Admission: RE | Admit: 2016-10-04 | Discharge: 2016-10-04 | Disposition: A | Discharge status: Home / Self Care | Payer: Medicare HMO | Source: Ambulatory Visit | Attending: Internal Medicine | Admitting: Internal Medicine

## 2016-10-04 ENCOUNTER — Ambulatory Visit: Payer: Medicare HMO | Admitting: Radiation Oncology

## 2016-10-04 VITALS — BP 149/95 | HR 61 | Resp 18 | Ht 67.0 in | Wt 196.0 lb

## 2016-10-04 DIAGNOSIS — C3431 Malignant neoplasm of lower lobe, right bronchus or lung: Secondary | ICD-10-CM | POA: Diagnosis present

## 2016-10-04 DIAGNOSIS — Z510 Encounter for antineoplastic radiation therapy: Secondary | ICD-10-CM | POA: Insufficient documentation

## 2016-10-04 DIAGNOSIS — C7949 Secondary malignant neoplasm of other parts of nervous system: Secondary | ICD-10-CM

## 2016-10-04 DIAGNOSIS — C7931 Secondary malignant neoplasm of brain: Secondary | ICD-10-CM | POA: Insufficient documentation

## 2016-10-04 DIAGNOSIS — R900 Intracranial space-occupying lesion found on diagnostic imaging of central nervous system: Secondary | ICD-10-CM

## 2016-10-04 DIAGNOSIS — C3491 Malignant neoplasm of unspecified part of right bronchus or lung: Secondary | ICD-10-CM

## 2016-10-04 HISTORY — DX: Malignant neoplasm of brain, unspecified: C71.9

## 2016-10-04 MED ORDER — OXYCODONE-ACETAMINOPHEN 5-325 MG PO TABS
1.0000 | ORAL_TABLET | ORAL | Status: AC
  Administered 2016-10-04: 1 via ORAL
  Filled 2016-10-04: qty 1

## 2016-10-04 NOTE — Progress Notes (Signed)
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  HISTORY OF PRESENT ILLNESS: [name] is a 83 y.o. female seen at the request of Dr. Cyndy Freeze for a new diagnosis of metastatic lung cancer. About three weeks ago, she was walking back to her apartment when she felt a tingling in the right arm, a notable right sided tremor, and lost consciousness and fell to the floor. She hit the right side of her face and had significant bleeding and ecchymosis of the face. She presented to the emergency department at [facility] on 09/13/2016 and she had CT scan of the head, maxillofacial as well as cervical spines. The scans showed left frontal white matter vasogenic edema was suspected underlying mass/metastatic disease. There was a small left frontotemporal subdural hematoma as well as anterior parafalcine subdural hemorrhage, with no midline shift. There was nondisplaced comminuted appearing fracture of the right occipital condyle and nondisplaced fracture of the right posterior arch of C1. There was also tabs 3 fracture of the odontoid process is with 2 mm posterior displacement.  MRI of the brain on 09/15/16 showed peripherally contrast-enhancing mass centered within the left precentral gyrus measuring approximately 2.0 cm. There was surrounding hyperintense T2 weighted signal indicative of vasogenic edema versus nonenhancing tumor. There was no other intracranial lesions. CT scan of the chest, abdomen and pelvis were performed on 09/16/16, showing 5.1 x 3.5 x 3.9 cm right lower lobe mass was borderline enlarged right hilar lymph node highly concerning for primary bronchogenic neoplasm. There  were also multiple indeterminant adrenal lesions in the right kidney. On 09/17/16 the patient underwent CT-guided core biopsy of the right lower lobe lung mass by interventional radiology. The final pathology was consistent with adenocarcinoma. She has met with Dr. Julien Nordmann and additional molecular tests are still pending, and she will have a PET on 10/14/16. She comes to discuss the role of radiotherapy for her brain disease. The patient is currently taking Decadron 2 mg every 8 hours. She does not have a pacemaker, but reports she does have stents.     PREVIOUS RADIATION THERAPY: Yes   Radiation treatment received in [place] in 1985 for uterine cancer.  PAST MEDICAL HISTORY:  Past Medical History:  Diagnosis Date  . A-fib ([place])   . Adenocarcinoma of right lung, stage 4 ([place]) 09/30/2016  . Brain cancer ([place])   . Coronary artery disease   . Hypertension   . MI (myocardial infarction)   . Skin cancer   . Subdural hematoma ([place]) 09/13/2016  . Uterine cancer ([place])       PAST SURGICAL HISTORY: Past Surgical History:  Procedure Laterality Date  . ABDOMINAL HYSTERECTOMY    . APPENDECTOMY    . CORONARY ANGIOPLASTY WITH STENT PLACEMENT    . REPLACEMENT TOTAL KNEE    . TONSILLECTOMY      FAMILY HISTORY:  Family History  Problem Relation Age of Onset  . Cancer Brother     bladder and stomach  . Cancer Brother     pancreatic    SOCIAL HISTORY:  Social History   Social History  . Marital status: Single    Spouse name: N/A  . Number of children: N/A  .  Years of education: N/A   Occupational History  . Not on file.   Social History Main Topics  . Smoking status: Former Smoker    Packs/day: 0.25    Years: 20.00    Types: Cigarettes    Quit date: 09/27/1974  . Smokeless tobacco: Never Used  . Alcohol use No  . Drug use: No  . Sexual activity: No   Other Topics Concern  . Not on file   Social History Narrative  . No narrative on file  The patient is single and has no  children. She used to work as a Animal nutritionist she has a history of smoking less than one pack per day for around 20 years but quit in 1976. She has no history of alcohol or drug abuse.  ALLERGIES: Codeine; Dabigatran; Iodine; Penicillins; and Sulfa antibiotics  MEDICATIONS:  Current Outpatient Prescriptions  Medication Sig Dispense Refill  . acetaminophen (TYLENOL) 325 MG tablet Take 325-650 mg by mouth every 6 (six) hours as needed (pain).     . Ascorbic Acid (VITAMIN C) 1000 MG tablet Take 1,000 mg by mouth daily.    Marland Kitchen atorvastatin (LIPITOR) 40 MG tablet Take 40 mg by mouth at bedtime.     . beta carotene w/minerals (OCUVITE) tablet Take 2 tablets by mouth daily.    . bisacodyl (DULCOLAX) 10 MG suppository Place 1 suppository (10 mg total) rectally as needed for moderate constipation. (Patient not taking: Reported on 10/04/2016) 12 suppository 0  . Calcium Carb-Cholecalciferol (CALCIUM 1000 + D PO) Take 1,000 mg by mouth daily.    Marland Kitchen dexamethasone (DECADRON) 2 MG tablet Take 1 tablet (2 mg total) by mouth every 8 (eight) hours.    Marland Kitchen diltiazem (TIAZAC) 240 MG 24 hr capsule Take 240 mg by mouth daily.     Marland Kitchen ezetimibe (ZETIA) 10 MG tablet Take 10 mg by mouth at bedtime.     . levETIRAcetam (KEPPRA) 500 MG tablet Take 1 tablet (500 mg total) by mouth 2 (two) times daily. 60 tablet 2  . metoprolol succinate (TOPROL-XL) 50 MG 24 hr tablet Take 50 mg by mouth daily.     . mirabegron ER (MYRBETRIQ) 50 MG TB24 tablet Take 50 mg by mouth daily.    Marland Kitchen omeprazole (PRILOSEC) 40 MG capsule Take 40 mg by mouth daily.    Marland Kitchen oxyCODONE-acetaminophen (PERCOCET/ROXICET) 5-325 MG tablet Take 1 tablet by mouth every 4 (four) hours as needed for moderate pain. 10 tablet 0  . polyethylene glycol (MIRALAX) packet Take 17 g by mouth daily as needed for mild constipation. (Patient not taking: Reported on 10/04/2016) 14 each 0  . Propylene Glycol-Glycerin (SOOTHE OP) Place 1 drop into both eyes daily.    . vitamin A  (VITAMIN A) 10000 UNIT capsule Take 10,000 Units by mouth daily. Take 2 tablets daily    . Zinc 50 MG TABS Take 50 mg by mouth daily.     No current facility-administered medications for this encounter.     REVIEW OF SYSTEMS:  On review of systems, the patient reports that she is doing well overall.  The patient reports hand tremors prior to her fall and subsequent diagnosis. She denies any tremors or seizure activity since the fall. She reports occasional frontal headaches relieved with Tylenol. She reports occasional blurry vision. She denies confusion or memory deficits. The patient denies dizziness. Denies nausea. The patient reports her hand coordination is improving. She experiences bilateral leg weakness and is participating in PT daily. She reports  discomfort related to C-collar. Additionally, the patient reports SOB with exertion. She denies chest pain, cough, or hemoptysis. She denies any bowel or bladder concerns. She reports unintended weight loss attributed to poor appetite; she reports her best meal is breakfast.She denies any chest pain, cough, fevers, chills, night sweats. and denies abdominal pain, nausea or vomiting. She denies any new musculoskeletal or joint aches or pains. A complete review of systems is obtained and is otherwise negative.    PHYSICAL EXAM:  Wt Readings from Last 3 Encounters:  10/04/16 196 lb (88.9 kg)  09/30/16 183 lb (83 kg)  09/14/16 196 lb 3.4 oz (89 kg)   Temp Readings from Last 3 Encounters:  09/30/16 98 F (36.7 C) (Oral)  09/18/16 98.1 F (36.7 C) (Oral)  12/17/15 98.5 F (36.9 C) (Oral)   BP Readings from Last 3 Encounters:  10/04/16 (!) 149/95  09/30/16 137/77  09/18/16 (!) 155/69   Pulse Readings from Last 3 Encounters:  10/04/16 61  09/30/16 (!) 51  09/18/16 (!) 57   In general this is an uncomfortable appearing elderly Caucasian female in no acute distress. She is alert and oriented x4 and appropriate throughout the examination.  EOMs are intact. PERRLA. There is significant echymosis of the right face and a large hematoma on her left frontal scalp with several eschars consistent with recent injury. She is wearing a C-collar and is seated conmfortably in a wheelchair. Cardiovascular exam reveals a regular rate and rhythm, no clicks rubs or murmurs are auscultated. Chest is clear to auscultation bilaterally. Lymphatic assessment is performed and does not reveal any adenopathy in the axillary, or inguinal chains, and is otherwise limited by the C collar. Abdomen has active bowel sounds in all quadrants and is intact. The abdomen is soft, non tender, non distended. Lower extremities are negative for pretibial pitting edema, deep calf tenderness, cyanosis or clubbing.  KPS = 40  100 - Normal; no complaints; no evidence of disease. 90   - Able to carry on normal activity; minor signs or symptoms of disease. 80   - Normal activity with effort; some signs or symptoms of disease. 53   - Cares for self; unable to carry on normal activity or to do active work. 60   - Requires occasional assistance, but is able to care for most of his personal needs. 50   - Requires considerable assistance and frequent medical care. 27   - Disabled; requires special care and assistance. 31   - Severely disabled; hospital admission is indicated although death not imminent. 83   - Very sick; hospital admission necessary; active supportive treatment necessary. 10   - Moribund; fatal processes progressing rapidly. 0     - Dead  Karnofsky DA, Abelmann [place], Craver LS and Burchenal [facility] [phone]) The use of the nitrogen mustards in the palliative treatment of carcinoma: with particular reference to bronchogenic carcinoma Cancer 1 [id number]  LABORATORY DATA:  Lab Results  Component Value Date   WBC 16.3 (H) 09/30/2016   HGB 16.3 (H) 09/30/2016   HCT 50.3 (H) 09/30/2016   MCV 82.5 09/30/2016   PLT 130 (L) 09/30/2016   Lab Results  Component Value Date   NA  134 (L) 09/30/2016   K 4.0 09/30/2016   CL 94 (L) 09/18/2016   CO2 24 09/30/2016   Lab Results  Component Value Date   ALT 19 09/30/2016   AST 18 09/30/2016   ALKPHOS 90 09/30/2016   BILITOT 1.03 09/30/2016  RADIOGRAPHY: Dg Chest 2 View  Result Date: 09/13/2016 CLINICAL DATA:  Initial evaluation for acute dizziness, fall. EXAM: CHEST  2 VIEW COMPARISON:  Prior radiograph from 01/01/2009. FINDINGS: Moderate cardiomegaly, slightly progressed relative to most recent radiograph from 2010. Mediastinal silhouette within normal limits. Aortic atherosclerosis, also slightly progressed. Lungs normally inflated. Mild diffuse interstitial prominence, suggesting mild interstitial edema. No focal infiltrates. No significant pleural effusion. No pneumothorax. No acute osseous abnormality. IMPRESSION: 1. Cardiomegaly with mild diffuse interstitial congestion/edema. Cardiomegaly has mildly progressed relative to most recent radiograph from 01/01/2009. 2. No other active cardiopulmonary disease. 3. Aortic atherosclerosis, also progressed relative to 2010. Electronically Signed   By: Jeannine Boga M.D.   On: 09/13/2016 21:40   Dg Tibia/fibula Right  Result Date: 09/13/2016 CLINICAL DATA:  Initial evaluation for acute trauma, fall. EXAM: RIGHT TIBIA AND FIBULA - 2 VIEW COMPARISON:  None. FINDINGS: There is no evidence of fracture or other focal bone lesions. So no acute soft tissue abnormality about the leg scattered atheromatous vascular calcifications noted posterior to the knee. Advanced degenerative changes noted about the partially visualized knee. IMPRESSION: 1. No acute osseous abnormality about the right tibia/fibula. 2. Advanced degenerative osteoarthrosis about the right knee. 3. Atherosclerotic disease. Electronically Signed   By: Jeannine Boga M.D.   On: 09/13/2016 21:42   Ct Head Wo Contrast  Result Date: 09/13/2016 CLINICAL DATA:  83 year old female with dizziness and fall.  History of breast cancer. EXAM: CT HEAD WITHOUT CONTRAST CT MAXILLOFACIAL WITHOUT CONTRAST CT CERVICAL SPINE WITHOUT CONTRAST TECHNIQUE: Multidetector CT imaging of the head, cervical spine, and maxillofacial structures were performed using the standard protocol without intravenous contrast. Multiplanar CT image reconstructions of the cervical spine and maxillofacial structures were also generated. COMPARISON:  Head CT dated 02/24/2009 FINDINGS: CT HEAD FINDINGS Brain: There is a left frontotemporal subdural hemorrhage measuring up to 4 mm in thickness. Right anterior parafalcine subdural hemorrhage measures 4 mm in thickness. An area of white matter edema noted over the left frontal convexity compatible with vasogenic edema. There is mild mass effect and effacement of the adjacent sulci. A 1.2 x 0.9 cm density in the left frontal convexity (series 2, image 23 and sagittal series 5 image 41) is concerning for a primary neoplasm versus metastatic disease. Further evaluation with MRI without and with contrast is recommended. There is mild age-related atrophy and chronic microvascular ischemic changes. No midline shift noted. The quadrigeminal plate cistern is patent. Vascular: No hyperdense vessel or unexpected calcification. Skull: Nondisplaced fracture of the right occipital condyle. No other calcaneal fracture. Other: Large left forehead hematoma. CT MAXILLOFACIAL FINDINGS Osseous: No fracture or mandibular dislocation. No destructive process. Chronic degenerative changes at the left TMJ. Orbits: Bilateral cataract surgeries noted. The globes and retro-orbital fat are preserved. Sinuses: Clear. Soft tissues: Left forehead hematoma. CT CERVICAL SPINE FINDINGS Alignment: No acute subluxation. Skull base and vertebrae: There is nondisplaced comminuted appearing fracture of the right occipital condyle. There is nondisplaced fracture of the right posterior arch of C1. There is incomplete bony fusion of the posterior  ring of C1. There is type III fracture of the base of the odontoid process with approximately 2 mm posterior displacement of the tip of the odontoid. Evaluation for fracture is very limited due to advanced osteopenia. There is apparent ankylosis of the C2-C5 posterior elements related to chronic changes and inflammation. Soft tissues and spinal canal: No prevertebral fluid or swelling. No visible canal hematoma. Disc levels:  Multilevel degenerative changes. Upper chest: Bilateral  carotid bulb atherosclerotic disease. Other: None IMPRESSION: Left frontal white matter vasogenic edema with suspected underlying mass/metastatic disease. MRI without and with contrast is recommended for further evaluation. Small left frontotemporal subdural hemorrhage as well as anterior parafalcine subdural hemorrhage. No midline shift. Nondisplaced comminuted appearing fracture of the right occipital condyle. Nondisplaced fracture of the right posterior arch of C1. Type III fracture of the odontoid process with 2 mm posterior displacement. These results were called by telephone at the time of interpretation on 09/13/2016 at 9:42 pm to Dr. Davonna Belling , who verbally acknowledged these results. Electronically Signed   By: Anner Crete M.D.   On: 09/13/2016 21:48   Ct Chest W Contrast  Result Date: 09/16/2016 CLINICAL DATA:  83 year old female with history of brain tumor, with recent dizziness and history of fall yesterday. Multiple cervical spine fractures. EXAM: CT CHEST, ABDOMEN, AND PELVIS WITH CONTRAST TECHNIQUE: Multidetector CT imaging of the chest, abdomen and pelvis was performed following the standard protocol during bolus administration of intravenous contrast. CONTRAST:  1 ISOVUE-300 IOPAMIDOL (ISOVUE-300) INJECTION 61% COMPARISON:  No priors. FINDINGS: CT CHEST FINDINGS Cardiovascular: Heart size is mildly enlarged. Trace amount of pericardial fluid and/or thickening adjacent to the left ventricle, unlikely to be  of any hemodynamic significance at this time. Small amount of pericardial calcification overlying the right atrioventricular groove inferiorly (image 47 of series 2). There is aortic atherosclerosis, as well as atherosclerosis of the great vessels of the mediastinum and the coronary arteries, including calcified atherosclerotic plaque in the left main, left anterior descending, left circumflex and right coronary arteries. Mediastinum/Nodes: Borderline enlarged 9 mm short axis right hilar lymph node (axial image 25 of series 2). No other mediastinal or left hilar lymphadenopathy is noted. Esophagus is unremarkable in appearance. No axillary lymphadenopathy. Lungs/Pleura: In the mid right lower lobe there is a 5.1 x 3.5 x 3.9 cm macrolobulated mass which makes contact with the overlying pleura (image 96 of series 3 and coronal image 109 of series 5). Most of the basal segmental bronchi to the right lower lobe appear occluded, either with soft tissue wall or retained secretions. Mild postobstructive changes are noted distal to the mass in the posterior basal aspect of the right lower lobe, but the right lower lobe is otherwise relatively well aerated. 6 mm nodule in the lateral segment of the right middle lobe (image 85 of series 3). A few other scattered 1-3 mm pulmonary nodules are noted throughout the lungs bilaterally, highly nonspecific. No acute consolidative airspace disease. No pleural effusions. Musculoskeletal: No acute displaced fractures or aggressive appearing lytic or blastic lesions are noted in the visualized portions of the skeleton. CT ABDOMEN PELVIS FINDINGS Hepatobiliary: No cystic or solid hepatic lesions are noted. No intra or extrahepatic biliary ductal dilatation. There is amorphous high attenuation material lying dependently in the gallbladder, compatible with biliary sludge (although some of this may reflect vicarious excretion of gadolinium related to recent MRI examination). No findings to  suggest an acute cholecystitis at this time. Pancreas: No pancreatic mass. No pancreatic ductal dilatation. No pancreatic or peripancreatic fluid or inflammatory changes. Spleen: In the medial aspect of the spleen there is a 1.8 cm hypervascular lesions which appears to normalize to the remainder of splenic parenchyma on delayed images, possibly indicative of a hemangioma. Adrenals/Urinary Tract: In the anterior aspect of the interpolar region of the left kidney there is a a 2.1 cm simple cyst. Multiple other subcentimeter low-attenuation lesions are noted in the kidneys bilaterally, too small  to characterize, but favored to represent cysts. In addition, however, there are multiple intermediate to high attenuation lesions in both kidneys which are incompletely characterized on today's examination. Several of these demonstrate no appreciable change in attenuation between portal venous phase and delayed images, favored to represent proteinaceous/hemorrhagic cysts, but overall incompletely characterized without noncontrast images. The largest of these lesions measure up to 2.2 cm in the lower pole of the right kidney. No hydroureteronephrosis. Urinary bladder is normal in appearance. Stomach/Bowel: The stomach is nearly decompressed, but otherwise unremarkable in appearance. There is no pathologic dilatation of small bowel or colon. Several colonic diverticulae are noted, without definite surrounding inflammatory changes to suggest an acute diverticulitis at this time. The appendix is not confidently identified and may be surgically absent. Regardless, there are no inflammatory changes noted adjacent to the cecum to suggest the presence of an acute appendicitis at this time. Vascular/Lymphatic: Aortic atherosclerosis, without aneurysm or dissection in the abdominal or pelvic vasculature. No lymphadenopathy is noted in the abdomen or pelvis. Reproductive: Status post hysterectomy. Ovaries are not confidently identified  may be surgically absent or atrophic. Other: No significant volume of ascites.  No pneumoperitoneum. Musculoskeletal: No acute displaced fractures in the visualized portions of the skeleton. Chronic appearing compression fracture of L3 with approximately 60% loss of central vertebral body height. There are no aggressive appearing lytic or blastic lesions noted in the visualized portions of the skeleton. Well-circumscribed low-attenuation lesion in the subcutaneous fat of the left lumbar region measuring 1.9 cm in diameter (image 64 of series 2) is nonspecific, but likely a sebaceous cyst. IMPRESSION: 1. No signs of significant acute traumatic injury to the chest, abdomen or pelvis. 2. 5.1 x 3.5 x 3.9 cm right lower lobe mass with borderline enlarged right hilar lymph node, highly concerning for primary bronchogenic neoplasm. Further evaluation with PET-CT and/or biopsy is recommended in the near future for diagnostic and staging purposes. 3. Multiple indeterminate renal lesions in the right kidney. Although these are favored to represent proteinaceous/hemorrhagic cysts, further evaluation with MRI of the abdomen with and without IV gadolinium could provide characterization if clinically appropriate. 4. Colonic diverticulosis without evidence of acute diverticulitis at this time. 5. Aortic atherosclerosis, in addition to left main and 3 vessel coronary artery disease. 6. Additional incidental findings, as above. Electronically Signed   By: Vinnie Langton M.D.   On: 09/16/2016 15:35   Ct Cervical Spine Wo Contrast  Result Date: 09/13/2016 CLINICAL DATA:  83 year old female with dizziness and fall. History of breast cancer. EXAM: CT HEAD WITHOUT CONTRAST CT MAXILLOFACIAL WITHOUT CONTRAST CT CERVICAL SPINE WITHOUT CONTRAST TECHNIQUE: Multidetector CT imaging of the head, cervical spine, and maxillofacial structures were performed using the standard protocol without intravenous contrast. Multiplanar CT image  reconstructions of the cervical spine and maxillofacial structures were also generated. COMPARISON:  Head CT dated 02/24/2009 FINDINGS: CT HEAD FINDINGS Brain: There is a left frontotemporal subdural hemorrhage measuring up to 4 mm in thickness. Right anterior parafalcine subdural hemorrhage measures 4 mm in thickness. An area of white matter edema noted over the left frontal convexity compatible with vasogenic edema. There is mild mass effect and effacement of the adjacent sulci. A 1.2 x 0.9 cm density in the left frontal convexity (series 2, image 23 and sagittal series 5 image 41) is concerning for a primary neoplasm versus metastatic disease. Further evaluation with MRI without and with contrast is recommended. There is mild age-related atrophy and chronic microvascular ischemic changes. No midline shift noted.  The quadrigeminal plate cistern is patent. Vascular: No hyperdense vessel or unexpected calcification. Skull: Nondisplaced fracture of the right occipital condyle. No other calcaneal fracture. Other: Large left forehead hematoma. CT MAXILLOFACIAL FINDINGS Osseous: No fracture or mandibular dislocation. No destructive process. Chronic degenerative changes at the left TMJ. Orbits: Bilateral cataract surgeries noted. The globes and retro-orbital fat are preserved. Sinuses: Clear. Soft tissues: Left forehead hematoma. CT CERVICAL SPINE FINDINGS Alignment: No acute subluxation. Skull base and vertebrae: There is nondisplaced comminuted appearing fracture of the right occipital condyle. There is nondisplaced fracture of the right posterior arch of C1. There is incomplete bony fusion of the posterior ring of C1. There is type III fracture of the base of the odontoid process with approximately 2 mm posterior displacement of the tip of the odontoid. Evaluation for fracture is very limited due to advanced osteopenia. There is apparent ankylosis of the C2-C5 posterior elements related to chronic changes and  inflammation. Soft tissues and spinal canal: No prevertebral fluid or swelling. No visible canal hematoma. Disc levels:  Multilevel degenerative changes. Upper chest: Bilateral carotid bulb atherosclerotic disease. Other: None IMPRESSION: Left frontal white matter vasogenic edema with suspected underlying mass/metastatic disease. MRI without and with contrast is recommended for further evaluation. Small left frontotemporal subdural hemorrhage as well as anterior parafalcine subdural hemorrhage. No midline shift. Nondisplaced comminuted appearing fracture of the right occipital condyle. Nondisplaced fracture of the right posterior arch of C1. Type III fracture of the odontoid process with 2 mm posterior displacement. These results were called by telephone at the time of interpretation on 09/13/2016 at 9:42 pm to Dr. Davonna Belling , who verbally acknowledged these results. Electronically Signed   By: Anner Crete M.D.   On: 09/13/2016 21:48   Mr Jeri Cos GU Contrast  Result Date: 10/01/2016 CLINICAL DATA:  Follow-up.  Stage IV metastatic lung cancer. EXAM: MRI HEAD WITHOUT AND WITH CONTRAST TECHNIQUE: Multiplanar, multiecho pulse sequences of the brain and surrounding structures were obtained without and with intravenous contrast. CONTRAST:  80m MULTIHANCE GADOBENATE DIMEGLUMINE 529 MG/ML IV SOLN COMPARISON:  09/15/2016 FINDINGS: Brain: Unchanged appearance of the intra-axial mass in the precentral gyrus with peripheral enhancement and mild internal proteinaceous or hemorrhagic components. Mass measures up to 21 mm. FLAIR hyperintensity in the surrounding white matter is decreased, consistent with improved vasogenic edema. Masses most consistent with a metastasis. Primary brain tumor considered unlikely. There is a 4 mm area of enhancement along the high right frontal convexity which was present before. On coronal reformats this appears extra-axial and dural-based and may reflect a meningioma. Dural  metastasis is a diagnostic possibility and close follow-up is anticipated. Subdural hematoma around the inferior left cerebral convexity is similar to prior when allowing for redistribution more inferiorly. Maximal thickness is 5 mm. Trace subdural hemorrhage along the left frontal pole is stable. Mild for age microvascular ischemic change in the cerebral white matter. Normal brain volume for age. Vascular: Normal flow voids Skull and upper cervical spine: Known dens fracture with mild posterior displacement is unchanged from prior imaging. There is multilevel facet arthropathy and ankylosis. Posterior C1 ring fracture not clearly visualized. Sinuses/Orbits: Negative Other: Decrease in the large left frontal scalp hematoma IMPRESSION: 1. 21 mm left precentral gyrus metastasis has an unchanged appearance compared to 09/15/2016. Associated vasogenic edema is diminished. 2. 4 mm focus of right frontal enhancement appears extra-axial, favoring incidental meningioma. Anticipate close imaging follow-up. 3. Similar appearance of subdural hematoma around the left cerebral convexity when allowing for  redistribution. Maximal thickness is 5 mm and there is no related mass effect. Electronically Signed   By: Monte Fantasia M.D.   On: 10/01/2016 11:38   Mr Jeri Cos DG Contrast  Result Date: 09/15/2016 CLINICAL DATA:  Preoperative planning for brain tumor resection. EXAM: MRI HEAD WITHOUT AND WITH CONTRAST TECHNIQUE: Multiplanar, multiecho pulse sequences of the brain and surrounding structures were obtained without and with intravenous contrast. CONTRAST:  54m MULTIHANCE GADOBENATE DIMEGLUMINE 529 MG/ML IV SOLN COMPARISON:  Brain MRI 09/14/2016 FINDINGS: Brain: Left convexity subdural hematoma has undergone redistribution, now primarily adjacent to left parietal lobe, measuring 5 mm. There is multifocal hyperintense T2-weighted signal within the periventricular white matter, most often seen in the setting of chronic  microvascular ischemia. Centered within the left precentral gyrus is a peripherally contrast-enhancing mass that measures 1.9 x 1.8 cm. There is surrounding hyperintense T2 weighted signal that extends anteriorly within the left frontal white matter and posteriorly into the left postcentral gyrus. There is no hydrocephalus or midline shift. No age advanced or lobar predominant atrophy. Vascular: Major intracranial arterial and venous sinus flow voids are preserved. No evidence of chronic microhemorrhage or amyloid angiopathy. Skull and upper cervical spine: Known fractures of the odontoid and left occipital condyles are better characterized on recent CT. Large left frontal scalp subgaleal hematoma is again noted. Extensive lateral left scalp soft tissue swelling also present. Sinuses/Orbits: No fluid levels or advanced mucosal thickening. No mastoid effusion. Normal orbits. IMPRESSION: 1. Peripherally contrast-enhancing mass centered within the left precentral gyrus, measuring approximately 2 cm. Surrounding hyperintense T2 weighted signal may indicate vasogenic edema versus non enhancing tumor. 2. No other intracranial lesions. 3. Redistribution of left subdural hematoma without midline shift or hydrocephalus. 4. Odontoid and occipital condyle fractures are better characterized on recent CT. Electronically Signed   By: KUlyses JarredM.D.   On: 09/15/2016 20:22   Mr BJeri CosWLOContrast  Result Date: 09/14/2016 CLINICAL DATA:  Initial evaluation for brain mass. EXAM: MRI HEAD WITHOUT AND WITH CONTRAST TECHNIQUE: Multiplanar, multiecho pulse sequences of the brain and surrounding structures were obtained without and with intravenous contrast. CONTRAST:  151mMULTIHANCE GADOBENATE DIMEGLUMINE 529 MG/ML IV SOLN COMPARISON:  Prior CT from 09/13/2016. FINDINGS: Brain: Study degraded by motion artifact. Cerebral volume within normal limits for age. Patchy T2/FLAIR hyperintensity within the periventricular and deep  white matter both cerebral hemispheres most consistent with chronic small vessel ischemic disease, mild in nature. There is a heterogeneous enhancing mass centered at the left frontal parietal convexity that measures 21 x 22 x 16 mm. Associated vasogenic edema within the left frontotemporal region without significant mass effect. No other mass lesion or abnormal enhancement identified. Differential considerations include possible primary CNS neoplasm or solitary metastasis. Diffusion-weighted imaging demonstrates no evidence for acute ischemic infarct. Acute subdural hematoma overlying the left frontotemporal convexity again noted. This measures up to 5 mm in maximal thickness. No midline shift. No hydrocephalus. Incidental note made of a partially empty sella. Midline structures intact. Vascular: Major intracranial vascular flow voids are preserved. Skull and upper cervical spine: Acute odontoid fracture noted, better evaluated on prior CT. Right occipital condyle fracture also better visualized on CT. Craniocervical junction remains widely patent. Visualized upper cervical spine within normal limits without acute injury. Large hematoma at the left frontal scalp with associated swelling. Sinuses/Orbits: Globes intact. No definite retro-orbital pathology. Mild left-sided proptosis. Patient is status post lens extraction. Paranasal sinuses are clear. No mastoid effusion. Inner ear structures normal. IMPRESSION:  1. 21 x 22 x 16 mm heterogeneous Lee enhancing mass at the left frontal parietal convexity with associated vasogenic edema. No other intracranial mass lesion identified. Finding may reflect a solitary intracranial metastasis or primary CNS neoplasm. 2. Acute left frontotemporal subdural hematoma measuring up to 5 mm without significant mass effect. No midline shift. 3. Acute odontoid and right occipital condyle fractures, better evaluated on prior CT. Craniocervical juncture remains widely patent, with no MRI  evidence for acute injury to the visualized upper cervical spinal cord. 4. Large hematoma at the left frontal scalp. Electronically Signed   By: Jeannine Boga M.D.   On: 09/14/2016 03:55   Ct Abdomen Pelvis W Contrast  Result Date: 09/16/2016 CLINICAL DATA:  83 year old female with history of brain tumor, with recent dizziness and history of fall yesterday. Multiple cervical spine fractures. EXAM: CT CHEST, ABDOMEN, AND PELVIS WITH CONTRAST TECHNIQUE: Multidetector CT imaging of the chest, abdomen and pelvis was performed following the standard protocol during bolus administration of intravenous contrast. CONTRAST:  1 ISOVUE-300 IOPAMIDOL (ISOVUE-300) INJECTION 61% COMPARISON:  No priors. FINDINGS: CT CHEST FINDINGS Cardiovascular: Heart size is mildly enlarged. Trace amount of pericardial fluid and/or thickening adjacent to the left ventricle, unlikely to be of any hemodynamic significance at this time. Small amount of pericardial calcification overlying the right atrioventricular groove inferiorly (image 47 of series 2). There is aortic atherosclerosis, as well as atherosclerosis of the great vessels of the mediastinum and the coronary arteries, including calcified atherosclerotic plaque in the left main, left anterior descending, left circumflex and right coronary arteries. Mediastinum/Nodes: Borderline enlarged 9 mm short axis right hilar lymph node (axial image 25 of series 2). No other mediastinal or left hilar lymphadenopathy is noted. Esophagus is unremarkable in appearance. No axillary lymphadenopathy. Lungs/Pleura: In the mid right lower lobe there is a 5.1 x 3.5 x 3.9 cm macrolobulated mass which makes contact with the overlying pleura (image 96 of series 3 and coronal image 109 of series 5). Most of the basal segmental bronchi to the right lower lobe appear occluded, either with soft tissue wall or retained secretions. Mild postobstructive changes are noted distal to the mass in the posterior  basal aspect of the right lower lobe, but the right lower lobe is otherwise relatively well aerated. 6 mm nodule in the lateral segment of the right middle lobe (image 85 of series 3). A few other scattered 1-3 mm pulmonary nodules are noted throughout the lungs bilaterally, highly nonspecific. No acute consolidative airspace disease. No pleural effusions. Musculoskeletal: No acute displaced fractures or aggressive appearing lytic or blastic lesions are noted in the visualized portions of the skeleton. CT ABDOMEN PELVIS FINDINGS Hepatobiliary: No cystic or solid hepatic lesions are noted. No intra or extrahepatic biliary ductal dilatation. There is amorphous high attenuation material lying dependently in the gallbladder, compatible with biliary sludge (although some of this may reflect vicarious excretion of gadolinium related to recent MRI examination). No findings to suggest an acute cholecystitis at this time. Pancreas: No pancreatic mass. No pancreatic ductal dilatation. No pancreatic or peripancreatic fluid or inflammatory changes. Spleen: In the medial aspect of the spleen there is a 1.8 cm hypervascular lesions which appears to normalize to the remainder of splenic parenchyma on delayed images, possibly indicative of a hemangioma. Adrenals/Urinary Tract: In the anterior aspect of the interpolar region of the left kidney there is a a 2.1 cm simple cyst. Multiple other subcentimeter low-attenuation lesions are noted in the kidneys bilaterally, too small to characterize,  but favored to represent cysts. In addition, however, there are multiple intermediate to high attenuation lesions in both kidneys which are incompletely characterized on today's examination. Several of these demonstrate no appreciable change in attenuation between portal venous phase and delayed images, favored to represent proteinaceous/hemorrhagic cysts, but overall incompletely characterized without noncontrast images. The largest of these  lesions measure up to 2.2 cm in the lower pole of the right kidney. No hydroureteronephrosis. Urinary bladder is normal in appearance. Stomach/Bowel: The stomach is nearly decompressed, but otherwise unremarkable in appearance. There is no pathologic dilatation of small bowel or colon. Several colonic diverticulae are noted, without definite surrounding inflammatory changes to suggest an acute diverticulitis at this time. The appendix is not confidently identified and may be surgically absent. Regardless, there are no inflammatory changes noted adjacent to the cecum to suggest the presence of an acute appendicitis at this time. Vascular/Lymphatic: Aortic atherosclerosis, without aneurysm or dissection in the abdominal or pelvic vasculature. No lymphadenopathy is noted in the abdomen or pelvis. Reproductive: Status post hysterectomy. Ovaries are not confidently identified may be surgically absent or atrophic. Other: No significant volume of ascites.  No pneumoperitoneum. Musculoskeletal: No acute displaced fractures in the visualized portions of the skeleton. Chronic appearing compression fracture of L3 with approximately 60% loss of central vertebral body height. There are no aggressive appearing lytic or blastic lesions noted in the visualized portions of the skeleton. Well-circumscribed low-attenuation lesion in the subcutaneous fat of the left lumbar region measuring 1.9 cm in diameter (image 64 of series 2) is nonspecific, but likely a sebaceous cyst. IMPRESSION: 1. No signs of significant acute traumatic injury to the chest, abdomen or pelvis. 2. 5.1 x 3.5 x 3.9 cm right lower lobe mass with borderline enlarged right hilar lymph node, highly concerning for primary bronchogenic neoplasm. Further evaluation with PET-CT and/or biopsy is recommended in the near future for diagnostic and staging purposes. 3. Multiple indeterminate renal lesions in the right kidney. Although these are favored to represent  proteinaceous/hemorrhagic cysts, further evaluation with MRI of the abdomen with and without IV gadolinium could provide characterization if clinically appropriate. 4. Colonic diverticulosis without evidence of acute diverticulitis at this time. 5. Aortic atherosclerosis, in addition to left main and 3 vessel coronary artery disease. 6. Additional incidental findings, as above. Electronically Signed   By: Vinnie Langton M.D.   On: 09/16/2016 15:35   Ct Biopsy  Result Date: 09/17/2016 INDICATION: 83 year old female with a history of right lower lobe mass. EXAM: CT BIOPSY MEDICATIONS: None. ANESTHESIA/SEDATION: Moderate (conscious) sedation was employed during this procedure. A total of Versed 1.0 mg and Fentanyl 50 mcg was administered intravenously. Moderate Sedation Time: 10 minutes. The patient's level of consciousness and vital signs were monitored continuously by radiology nursing throughout the procedure under my direct supervision. FLUOROSCOPY TIME:  None COMPLICATIONS: None PROCEDURE: The procedure, risks, benefits, and alternatives were explained to the patient and the patient's family. Specific risks that were addressed included bleeding, infection, pneumothorax, need for further procedure including chest tube placement, chance of delayed pneumothorax or hemorrhage, hemoptysis, nondiagnostic sample, cardiopulmonary collapse, death. Questions regarding the procedure were encouraged and answered. The patient understands and consents to the procedure. Patient was positioned in the right acute position on the CT gantry table and a scout CT of the chest was performed for planning purposes. Once angle of approach was determined, the skin and subcutaneous tissues this scan was prepped and draped in the usual sterile fashion, and a sterile drape was  applied covering the operative field. A sterile gown and sterile gloves were used for the procedure. Local anesthesia was provided with 1% Lidocaine. The skin  and subcutaneous tissues were infiltrated 1% lidocaine for local anesthesia, and a small stab incision was made with an 11 blade scalpel. Using CT guidance, a 17 gauge trocar needle was advanced into the right lower lobetarget. After confirmation of the tip, separate 18 gauge core biopsies were performed. These were placed into solution for transportation to the lab. Biosentry Device was deployed. A final CT image was performed. Patient tolerated the procedure well and remained hemodynamically stable throughout. No complications were encountered and no significant blood loss was encounter IMPRESSION: Status post CT-guided biopsy of right lower lobe mass. Tissue specimen sent to pathology for complete histopathologic analysis. Signed, Dulcy Fanny. Earleen Newport, DO [facility] Radiology Electronically Signed   By: Corrie Mckusick D.O.   On: 09/17/2016 16:47   Dg Chest Port 1 View  Result Date: 09/17/2016 CLINICAL DATA:  Pneumothorax after biopsy. EXAM: PORTABLE CHEST 1 VIEW COMPARISON:  CT scan from earlier today. FINDINGS: Upright AP portable view of the chest shows no evidence for pneumothorax. Focal nodular opacity projecting over the right heart border is compatible with the patient's known right lower lobe lung mass. No pulmonary edema or substantial pleural effusion. The cardio pericardial silhouette is enlarged. The visualized bony structures of the thorax are intact. Telemetry leads overlie the chest. IMPRESSION: No evidence for pneumothorax. Electronically Signed   By: Misty Stanley M.D.   On: 09/17/2016 17:07   Ct Maxillofacial Wo Contrast  Result Date: 09/13/2016 CLINICAL DATA:  83 year old female with dizziness and fall. History of breast cancer. EXAM: CT HEAD WITHOUT CONTRAST CT MAXILLOFACIAL WITHOUT CONTRAST CT CERVICAL SPINE WITHOUT CONTRAST TECHNIQUE: Multidetector CT imaging of the head, cervical spine, and maxillofacial structures were performed using  the standard protocol without intravenous contrast. Multiplanar CT image reconstructions of the cervical spine and maxillofacial structures were also generated. COMPARISON:  Head CT dated 02/24/2009 FINDINGS: CT HEAD FINDINGS Brain: There is a left frontotemporal subdural hemorrhage measuring up to 4 mm in thickness. Right anterior parafalcine subdural hemorrhage measures 4 mm in thickness. An area of white matter edema noted over the left frontal convexity compatible with vasogenic edema. There is mild mass effect and effacement of the adjacent sulci. A 1.2 x 0.9 cm density in the left frontal convexity (series 2, image 23 and sagittal series 5 image 41) is concerning for a primary neoplasm versus metastatic disease. Further evaluation with MRI without and with contrast is recommended. There is mild age-related atrophy and chronic microvascular ischemic changes. No midline shift noted. The quadrigeminal plate cistern is patent. Vascular: No hyperdense vessel or unexpected calcification. Skull: Nondisplaced fracture of the right occipital condyle. No other calcaneal fracture. Other: Large left forehead hematoma. CT MAXILLOFACIAL FINDINGS Osseous: No fracture or mandibular dislocation. No destructive process. Chronic degenerative changes at the left TMJ. Orbits: Bilateral cataract surgeries noted. The globes and retro-orbital fat are preserved. Sinuses: Clear. Soft tissues: Left forehead hematoma. CT CERVICAL SPINE FINDINGS Alignment: No acute subluxation. Skull base and vertebrae: There is nondisplaced comminuted appearing fracture of the right occipital condyle. There is nondisplaced fracture of the right posterior arch of C1. There is incomplete bony fusion of the posterior ring of C1. There is type III fracture of the base of the odontoid process with approximately 2 mm posterior displacement of the tip of the odontoid. Evaluation for fracture is very  limited due to advanced osteopenia. There is apparent  ankylosis of the C2-C5 posterior elements related to chronic changes and inflammation. Soft tissues and spinal canal: No prevertebral fluid or swelling. No visible canal hematoma. Disc levels:  Multilevel degenerative changes. Upper chest: Bilateral carotid bulb atherosclerotic disease. Other: None IMPRESSION: Left frontal white matter vasogenic edema with suspected underlying mass/metastatic disease. MRI without and with contrast is recommended for further evaluation. Small left frontotemporal subdural hemorrhage as well as anterior parafalcine subdural hemorrhage. No midline shift. Nondisplaced comminuted appearing fracture of the right occipital condyle. Nondisplaced fracture of the right posterior arch of C1. Type III fracture of the odontoid process with 2 mm posterior displacement. These results were called by telephone at the time of interpretation on 09/13/2016 at 9:42 pm to Dr. Davonna Belling , who verbally acknowledged these results. Electronically Signed   By: Anner Crete M.D.   On: 09/13/2016 21:48      IMPRESSION/PLAN: 1. 83 y.o.  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 Dr. Tammi Klippel met with the patient and her friend today about the findings and work-up thus far.  We discussed the natural history of metastatic brain disease in the setting of lung cancer, and general treatment, highlighting the role of radiotherapy in the management.  We discussed the available radiation techniques, and focused on the details of logistics and delivery. Dr. Tammi Klippel recommends a single course of SRS to the left frontoparietal tumor. We reviewed the anticipated acute and late sequelae associated with radiation in this setting. A consent form was discussed, signed, and placed in the patient's chart.  The patient would like to proceed with radiation and is scheduled for CT simulation later today.  The patient's C-collar will be removed for simulation  per Dr. Cyndy Freeze, but replaced immediately following.   The above documentation reflects my direct findings during this shared patient visit. Please see the separate note by Dr. Tammi Klippel on this date for the remainder of the patient's plan of care.   Carola Rhine, PAC   This document serves as a record of services personally performed by Tyler Pita, MD and Shona Simpson, PA. It was created on his behalf by Maryla Morrow, a trained medical scribe. The creation of this record is based on the scribe's personal observations and the provider's statements to them. This document has been checked and approved by the attending provider.

## 2016-10-04 NOTE — Progress Notes (Signed)
Location/Histology of Brain Tumor: stage IV (T2b, N1, M Ib) non-small cell lung cancer, adenocarcinoma presented with large right lower lobe lung mass with questionable hilar lymphadenopathy and a solitary metastatic brain lesions diagnosed in December 2017.  The patient lives in independent living facility at [facility]. The patient mentions that she was walking back to her apartment when she felt a tingling in the right arm with dizzy spells. She lost consciousness and fell to the floor. She hit the right side of her face and had significant bleeding and ecchymosis in that area. She presented to the emergency department at [facility] on 09/13/2016 and she had CT scan of the head, maxillofacial as well as cervical spines.    Past or anticipated interventions, if any, per neurosurgery: evaluated by Dr. Cyndy Freeze while hospitalized and referred to Dr. Tammi Klippel for [facility].   Past or anticipated interventions, if any, per medical oncology: Evaluated by Dr. Julien Nordmann in lung clinic on 09/30/16. He recommended for the patient to complete the staging workup with PET to rule out metastatic disease . Dr. Julien Nordmann also asked the pathologist to send the tissue block for PDL 1 expression and molecular testing.   Dose of Decadron, if applicable: 2 mg every eight hours  Recent neurologic symptoms, if any:   Seizures: No. Reports hand tremors prior to fall and diagnosis. Denies any tremors or seizure activity since.  Headaches: occasional frontal headaches relieved with Tylenol.   Nausea: denies  Dizziness/ataxia: denies  Difficulty with hand coordination: improving  Focal numbness/weakness: bilateral leg weakness; participating in PT daily  Visual deficits/changes: occasional blurry vision  Confusion/Memory deficits: no  Painful bone metastases at present, if any: Reports discomfort related to c collar.  SAFETY ISSUES:  Prior radiation? Yes in 1985. Received tx in [place] for uterine cancer. Patient  unsure of the provider or facility name  Pacemaker/ICD? No but, has stents  Possible current pregnancy? No  Is the patient on methotrexate? No  Additional Complaints / other details: 83 year old female. Single. No children. Brother with stomach and bladder. Another brother with pancreatic.   SOB with exertion. Denies cough or hemoptysis. Denies chest pain. Denies diarrhea or constipation.

## 2016-10-04 NOTE — Progress Notes (Signed)
See progress note under physician encounter. 

## 2016-10-05 ENCOUNTER — Telehealth: Payer: Self-pay | Admitting: Radiation Therapy

## 2016-10-05 ENCOUNTER — Telehealth: Payer: Self-pay | Admitting: Internal Medicine

## 2016-10-05 NOTE — Telephone Encounter (Signed)
Not able to reach patient or leave message re 1/25 lab/fu. Schedule mailed.

## 2016-10-05 NOTE — Telephone Encounter (Signed)
Spoke with Juliann Pulse at [facility] to make them aware of the patient's [facility] and treatment appointments.  Her schedule was shifted to allow more time for healing of her facial wounds from her fall.   CT SIM is 1/19 and treatment 1/24. Juliann Pulse documented this for the patient's transportation needs.  Mont Dutton R.T.(R)(T)

## 2016-10-06 ENCOUNTER — Ambulatory Visit: Payer: Medicare HMO | Admitting: Radiation Oncology

## 2016-10-08 ENCOUNTER — Ambulatory Visit: Payer: Medicare HMO | Admitting: Radiation Oncology

## 2016-10-13 ENCOUNTER — Telehealth: Payer: Self-pay | Admitting: Internal Medicine

## 2016-10-13 ENCOUNTER — Telehealth: Payer: Self-pay | Admitting: *Deleted

## 2016-10-13 ENCOUNTER — Encounter (HOSPITAL_COMMUNITY): Payer: Self-pay

## 2016-10-13 NOTE — Telephone Encounter (Signed)
FYI "Manuela Schwartz with [facility]iology [facility] Scheduling.  Calling to let you know this patient's facility called to reschedule the PET scan due to weather and no transportation.  Scans are now scheduled 10-27-2016 which falls currently scheduled f/u on 10-21-2016."    Scheduling message sent

## 2016-10-13 NOTE — Telephone Encounter (Signed)
Called nursing facility to inform them of pt appt 2/6 at 10 am with MD

## 2016-10-14 ENCOUNTER — Ambulatory Visit (HOSPITAL_COMMUNITY): Payer: Medicare HMO

## 2016-10-15 ENCOUNTER — Ambulatory Visit
Admission: RE | Admit: 2016-10-15 | Discharge: 2016-10-15 | Disposition: A | Discharge status: Home / Self Care | Payer: Medicare HMO | Source: Ambulatory Visit | Attending: Radiation Oncology | Admitting: Radiation Oncology

## 2016-10-15 DIAGNOSIS — Z510 Encounter for antineoplastic radiation therapy: Secondary | ICD-10-CM | POA: Diagnosis not present

## 2016-10-15 DIAGNOSIS — C7931 Secondary malignant neoplasm of brain: Secondary | ICD-10-CM

## 2016-10-15 NOTE — Progress Notes (Signed)
  Radiation Oncology         [phone]) [phone] ________________________________  Name: [name] MRN: [id number]  Date: 10/15/2016  DOB: October 20, 1929  SIMULATION AND TREATMENT PLANNING NOTE    ICD-9-CM ICD-10-CM   1. 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  Identity was confirmed.  All relevant records and images related to the planned course of therapy were reviewed.  The patient freely provided informed written consent to proceed with treatment after reviewing the details related to the planned course of therapy. The consent form was witnessed and verified by the simulation staff. Intravenous access was established for contrast administration. Then, the patient was set-up in a stable reproducible supine position for radiation therapy.  A relocatable thermoplastic stereotactic head frame was fabricated for precise immobilization.  CT images were obtained.  Surface markings were placed.  The CT images were loaded into the planning software and fused with the patient's targeting MRI scan.  Then the target and avoidance structures were contoured.  Treatment planning then occurred.  The radiation prescription was entered and confirmed.  I have requested 3D planning  I have requested a DVH of the following structures: Brain stem, brain, left eye, right eye, lenses, optic chiasm, target volumes, uninvolved brain, and normal tissue.    SPECIAL TREATMENT PROCEDURE:  The planned course of therapy using radiation constitutes a special treatment procedure. Special care is required in the management of this patient for the following reasons. This treatment constitutes a Special Treatment Procedure for the following reason: High dose per fraction requiring special monitoring for increased toxicities of treatment including  daily imaging.  The special nature of the planned course of radiotherapy will require increased physician supervision and oversight to ensure patient's safety with optimal treatment outcomes.  PLAN:  The patient will receive 18 Gy in 1 fraction.  ________________________________  Sheral Apley Tammi Klippel, M.D.

## 2016-10-18 DIAGNOSIS — Z510 Encounter for antineoplastic radiation therapy: Secondary | ICD-10-CM | POA: Diagnosis not present

## 2016-10-19 ENCOUNTER — Encounter (HOSPITAL_COMMUNITY): Payer: Self-pay

## 2016-10-20 ENCOUNTER — Ambulatory Visit
Admission: RE | Admit: 2016-10-20 | Discharge: 2016-10-20 | Disposition: A | Discharge status: Home / Self Care | Payer: Medicare HMO | Source: Ambulatory Visit | Attending: Radiation Oncology | Admitting: Radiation Oncology

## 2016-10-20 VITALS — BP 135/63 | HR 52 | Temp 97.5°F | Resp 18

## 2016-10-20 DIAGNOSIS — Z510 Encounter for antineoplastic radiation therapy: Secondary | ICD-10-CM | POA: Diagnosis not present

## 2016-10-20 DIAGNOSIS — C7931 Secondary malignant neoplasm of brain: Secondary | ICD-10-CM | POA: Diagnosis not present

## 2016-10-20 NOTE — Op Note (Signed)
  Name: [name]  MRN: [id number]  Date: 10/20/2016   DOB: May 18, 1930  Stereotactic Radiosurgery Operative Note  PRE-OPERATIVE DIAGNOSIS:  Solitary Brain Metastasis  POST-OPERATIVE DIAGNOSIS:  Solitary Brain Metastasis  PROCEDURE:  Stereotactic Radiosurgery  SURGEON:  Kevan Ny Klaira Pesci, MD  NARRATIVE: The patient underwent a radiation treatment planning session in the radiation oncology simulation suite under the care of the radiation oncology physician and physicist.  I participated closely in the radiation treatment planning afterwards. The patient underwent planning CT which was fused to 3T high resolution MRI with 1 mm axial slices.  These images were fused on the planning system.  We contoured the gross target volumes and subsequently expanded this to yield the Planning Target Volume. I actively participated in the planning process.  I helped to define and review the target contours and also the contours of the optic pathway, eyes, brainstem and selected nearby organs at risk.  All the dose constraints for critical structures were reviewed and compared to AAPM Task Group 101.  The prescription dose conformity was reviewed.  I approved the plan electronically.    Accordingly, [name] was brought to the TrueBeam stereotactic radiation treatment linac and placed in the custom immobilization mask.  The patient was aligned according to the IR fiducial markers with BrainLab Exactrac, then orthogonal x-rays were used in ExacTrac with the 6DOF robotic table and the shifts were made to align the patient  [name] received stereotactic radiosurgery uneventfully.    The detailed description of the procedure is recorded in the radiation oncology procedure note.  I was present for the duration of the procedure.  DISPOSITION:  Following delivery, the patient was transported to nursing in stable condition and monitored for possible acute effects to be discharged to home in stable condition  with follow-up in one month.  Kevan Ny Lennon Richins, MD 10/20/2016 11:33 AM

## 2016-10-20 NOTE — Progress Notes (Signed)
  Radiation Oncology         [phone]) [phone] ________________________________  Stereotactic Treatment Procedure Note  Name: Solstice Lastinger MRN: [id number]  Date: 10/20/2016  DOB: Aug 05, 1930  SPECIAL TREATMENT PROCEDURE    ICD-9-CM ICD-10-CM   1. Brain metastasis ([place]) 198.3 C79.31     3D TREATMENT PLANNING AND DOSIMETRY:  The patient's radiation plan was reviewed and approved by neurosurgery and radiation oncology prior to treatment.  It showed 3-dimensional radiation distributions overlaid onto the planning CT/MRI image set.  [facility] for the target structures as well as the organs at risk were reviewed. The documentation of the 3D plan and dosimetry are filed in the radiation oncology EMR.  NARRATIVE:  Michaeleen Down was brought to the TrueBeam stereotactic radiation treatment machine and placed supine on the CT couch. The head frame was applied, and the patient was set up for stereotactic radiosurgery.  Neurosurgery was present for the set-up and delivery  SIMULATION VERIFICATION:  In the couch zero-angle position, the patient underwent Exactrac imaging using the Brainlab system with orthogonal KV images.  These were carefully aligned and repeated to confirm treatment position for each of the isocenters.  The Exactrac snap film verification was repeated at each couch angle.  PROCEDURE: Foy [place]ara received stereotactic radiosurgery to the following targets: Left parietal 21 mm target was treated using 3 Dynamic Conformal Arcs to a prescription dose of 18 Gy.  ExacTrac registration was performed for each couch angle, requiring override at one out of three angles due to ExacTrac error.  The 80.6% isodose line was prescribed.  6 MV X-rays were delivered in the flattening filter free beam mode.  STEREOTACTIC TREATMENT MANAGEMENT:  Following delivery, the patient was transported to nursing in stable condition and monitored for possible acute effects.  Vital signs were recorded BP 135/63 (BP  Location: Left Arm, Patient Position: Sitting, Cuff Size: Normal)   Pulse (!) 52   Temp 97.5 F (36.4 C) (Oral)   Resp 18   SpO2 99% . The patient tolerated treatment without significant acute effects, and was discharged to home in stable condition.    PLAN: Follow-up in one month.  ________________________________  Sheral Apley. Tammi Klippel, M.D.

## 2016-10-21 ENCOUNTER — Encounter: Payer: Self-pay | Admitting: Radiation Oncology

## 2016-10-21 ENCOUNTER — Other Ambulatory Visit: Payer: Medicare HMO

## 2016-10-21 ENCOUNTER — Ambulatory Visit: Payer: Medicare HMO | Admitting: Internal Medicine

## 2016-10-21 ENCOUNTER — Telehealth: Payer: Self-pay | Admitting: Internal Medicine

## 2016-10-21 NOTE — Telephone Encounter (Signed)
Faxed records to [employer]

## 2016-10-21 NOTE — Progress Notes (Signed)
  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 Patient reported hand tremors prior to diagnosis. She reports occasional headaches relieved with Tylenol, bilateral leg weakness, and occasional blurry vision.  Plan: The patient has completed radiation treatment. The patient will return to radiation oncology clinic for routine followup in one month. I advised her to call or return sooner if she has any questions or concerns related to her recovery or treatment. ________________________________  Sheral Apley. Tammi Klippel, M.D.   This document serves as a record of services personally performed by Tyler Pita, MD. It was created on his behalf by Bethann Humble, a trained medical scribe. The creation of this record is based on the scribe's personal observations and the provider's statements to them. This document has been checked and approved by the attending provider.

## 2016-10-23 ENCOUNTER — Emergency Department (HOSPITAL_COMMUNITY)
Admission: EM | Admit: 2016-10-23 | Discharge: 2016-10-24 | Disposition: A | Discharge status: Home / Self Care | Payer: Medicare HMO | Attending: Emergency Medicine | Admitting: Emergency Medicine

## 2016-10-23 DIAGNOSIS — I2510 Atherosclerotic heart disease of native coronary artery without angina pectoris: Secondary | ICD-10-CM | POA: Diagnosis not present

## 2016-10-23 DIAGNOSIS — K668 Other specified disorders of peritoneum: Secondary | ICD-10-CM | POA: Insufficient documentation

## 2016-10-23 DIAGNOSIS — Z8541 Personal history of malignant neoplasm of cervix uteri: Secondary | ICD-10-CM | POA: Insufficient documentation

## 2016-10-23 DIAGNOSIS — Z87891 Personal history of nicotine dependence: Secondary | ICD-10-CM | POA: Insufficient documentation

## 2016-10-23 DIAGNOSIS — Z85828 Personal history of other malignant neoplasm of skin: Secondary | ICD-10-CM | POA: Insufficient documentation

## 2016-10-23 DIAGNOSIS — R1084 Generalized abdominal pain: Secondary | ICD-10-CM | POA: Insufficient documentation

## 2016-10-23 DIAGNOSIS — Z85841 Personal history of malignant neoplasm of brain: Secondary | ICD-10-CM | POA: Insufficient documentation

## 2016-10-23 DIAGNOSIS — Z79899 Other long term (current) drug therapy: Secondary | ICD-10-CM | POA: Diagnosis not present

## 2016-10-23 DIAGNOSIS — I252 Old myocardial infarction: Secondary | ICD-10-CM | POA: Insufficient documentation

## 2016-10-23 DIAGNOSIS — R109 Unspecified abdominal pain: Secondary | ICD-10-CM | POA: Diagnosis present

## 2016-10-23 DIAGNOSIS — D72829 Elevated white blood cell count, unspecified: Secondary | ICD-10-CM | POA: Diagnosis not present

## 2016-10-23 LAB — COMPREHENSIVE METABOLIC PANEL
ALBUMIN: 1.7 g/dL — AB (ref 3.5–5.0)
ALT: 26 U/L (ref 14–54)
ANION GAP: 14 (ref 5–15)
AST: 25 U/L (ref 15–41)
Alkaline Phosphatase: 75 U/L (ref 38–126)
BUN: 22 mg/dL — ABNORMAL HIGH (ref 6–20)
CO2: 23 mmol/L (ref 22–32)
Calcium: 8.5 mg/dL — ABNORMAL LOW (ref 8.9–10.3)
Chloride: 92 mmol/L — ABNORMAL LOW (ref 101–111)
Creatinine, Ser: 0.73 mg/dL (ref 0.44–1.00)
GFR calc Af Amer: >60 mL/min (ref >60)
GFR calc non Af Amer: >60 mL/min (ref >60)
GLUCOSE: 217 mg/dL — AB (ref 65–99)
POTASSIUM: 3.8 mmol/L (ref 3.5–5.1)
SODIUM: 129 mmol/L — AB (ref 135–145)
Total Bilirubin: 0.7 mg/dL (ref 0.3–1.2)
Total Protein: 4.5 g/dL — ABNORMAL LOW (ref 6.5–8.1)

## 2016-10-23 LAB — CBC WITH DIFFERENTIAL/PLATELET
BASOS ABS: 0 10*3/uL (ref 0.0–0.1)
Basophils Relative: 0 %
Eosinophils Absolute: 0 10*3/uL (ref 0.0–0.7)
Eosinophils Relative: 0 %
HCT: 44.9 % (ref 36.0–46.0)
Hemoglobin: 15.7 g/dL — ABNORMAL HIGH (ref 12.0–15.0)
LYMPHS ABS: 0.6 10*3/uL — AB (ref 0.7–4.0)
LYMPHS PCT: 4 %
MCH: 27.9 pg (ref 26.0–34.0)
MCHC: 35 g/dL (ref 30.0–36.0)
MCV: 79.8 fL (ref 78.0–100.0)
Monocytes Absolute: 0.6 10*3/uL (ref 0.1–1.0)
Monocytes Relative: 4 %
NEUTROS ABS: 13.9 10*3/uL — AB (ref 1.7–7.7)
Neutrophils Relative %: 92 %
Platelets: 96 10*3/uL — ABNORMAL LOW (ref 150–400)
RBC: 5.63 MIL/uL — ABNORMAL HIGH (ref 3.87–5.11)
RDW: 17.3 % — ABNORMAL HIGH (ref 11.5–15.5)
WBC: 15.1 10*3/uL — AB (ref 4.0–10.5)

## 2016-10-23 LAB — LIPASE, BLOOD: Lipase: 21 U/L (ref 11–51)

## 2016-10-23 LAB — I-STAT CHEM 8, ED
BUN: 23 mg/dL — ABNORMAL HIGH (ref 6–20)
CALCIUM ION: 1.05 mmol/L — AB (ref 1.15–1.40)
Chloride: 91 mmol/L — ABNORMAL LOW (ref 101–111)
Creatinine, Ser: 0.7 mg/dL (ref 0.44–1.00)
Glucose, Bld: 216 mg/dL — ABNORMAL HIGH (ref 65–99)
HCT: 47 % — ABNORMAL HIGH (ref 36.0–46.0)
Hemoglobin: 16 g/dL — ABNORMAL HIGH (ref 12.0–15.0)
Potassium: 3.8 mmol/L (ref 3.5–5.1)
SODIUM: 130 mmol/L — AB (ref 135–145)
TCO2: 28 mmol/L (ref 0–100)

## 2016-10-23 MED ORDER — ONDANSETRON HCL 4 MG/2ML IJ SOLN
4.0000 mg | Freq: Once | INTRAMUSCULAR | Status: AC
Start: 2016-10-23 — End: 2016-10-23
  Administered 2016-10-23: 4 mg via INTRAVENOUS
  Filled 2016-10-23: qty 2

## 2016-10-23 MED ORDER — FENTANYL CITRATE (PF) 100 MCG/2ML IJ SOLN
25.0000 ug | Freq: Once | INTRAMUSCULAR | Status: AC
  Administered 2016-10-23: 25 ug via INTRAVENOUS
  Filled 2016-10-23: qty 2

## 2016-10-23 NOTE — ED Triage Notes (Signed)
Pt from [facility]. C/o abdominal pain since yesterday.denies N/V/D

## 2016-10-23 NOTE — ED Provider Notes (Signed)
[facility] DEPT Provider Note  CSN: [id number] Arrival date & time: 10/23/16  2137  History   Chief Complaint Chief Complaint  Patient presents with  . Abdominal Pain   HPI [name] is a 83 y.o. female.  The history is provided by the patient, medical records and a relative. No language interpreter was used.  Illness  This is a new problem. The current episode started 12 to 24 hours ago. The problem occurs constantly. The problem has been gradually worsening. Associated symptoms include abdominal pain. Pertinent negatives include no chest pain, no headaches and no shortness of breath. Nothing aggravates the symptoms. Nothing relieves the symptoms.    Past Medical History:  Diagnosis Date  . A-fib ([place])   . Adenocarcinoma of right lung, stage 4 ([place]) 09/30/2016  . Brain cancer ([place])   . Coronary artery disease   . Hypertension   . MI (myocardial infarction)   . Skin cancer   . Subdural hematoma ([place]) 09/13/2016  . Uterine cancer [facility])    Patient Active Problem List   Diagnosis Date Noted  . Brain metastasis ([place]) 09/30/2016  . Adenocarcinoma of right lung, stage 4 ([place]) 09/30/2016  . Mass of right lung 09/18/2016  . Subdural hematoma ([place]) 09/14/2016  . A-fib ([place]) 09/14/2016  . Coronary artery disease 09/14/2016  . Hypertension 09/14/2016  . MI (myocardial infarction) 09/14/2016  . Uterine cancer ([place]) 09/14/2016  . Intracranial mass 09/14/2016   Past Surgical History:  Procedure Laterality Date  . ABDOMINAL HYSTERECTOMY    . APPENDECTOMY    . CORONARY ANGIOPLASTY WITH STENT PLACEMENT    . REPLACEMENT TOTAL KNEE    . TONSILLECTOMY     OB History    No data available      Home Medications    Prior to Admission medications   Medication Sig Start Date End Date Taking? Authorizing Provider  acetaminophen (TYLENOL) 325 MG tablet Take 325-650 mg by mouth every 6 (six) hours as needed (pain).     Historical Provider, MD  Ascorbic Acid (VITAMIN C) 1000 MG  tablet Take 1,000 mg by mouth daily.    Historical Provider, MD  atorvastatin (LIPITOR) 40 MG tablet Take 40 mg by mouth at bedtime.     Historical Provider, MD  beta carotene w/minerals (OCUVITE) tablet Take 2 tablets by mouth daily.    Historical Provider, MD  bisacodyl (DULCOLAX) 10 MG suppository Place 1 suppository (10 mg total) rectally as needed for moderate constipation. Patient not taking: Reported on 10/04/2016 09/18/16   Debbe Odea, MD  Calcium Carb-Cholecalciferol (CALCIUM 1000 + D PO) Take 1,000 mg by mouth daily.    Historical Provider, MD  dexamethasone (DECADRON) 2 MG tablet Take 1 tablet (2 mg total) by mouth every 8 (eight) hours. 09/18/16   Debbe Odea, MD  diltiazem (TIAZAC) 240 MG 24 hr capsule Take 240 mg by mouth daily.  07/07/16   Historical Provider, MD  ezetimibe (ZETIA) 10 MG tablet Take 10 mg by mouth at bedtime.     Historical Provider, MD  levETIRAcetam (KEPPRA) 500 MG tablet Take 1 tablet (500 mg total) by mouth 2 (two) times daily. 09/14/16   Kevan Ny Ditty, MD  metoprolol succinate (TOPROL-XL) 50 MG 24 hr tablet Take 50 mg by mouth daily.  07/29/16   Historical Provider, MD  mirabegron ER (MYRBETRIQ) 50 MG TB24 tablet Take 50 mg by mouth daily.    Historical Provider, MD  omeprazole (PRILOSEC) 40 MG capsule Take 40 mg by mouth daily.  Historical Provider, MD  oxyCODONE-acetaminophen (PERCOCET/ROXICET) 5-325 MG tablet Take 1 tablet by mouth every 4 (four) hours as needed for moderate pain. 09/18/16   Debbe Odea, MD  polyethylene glycol (MIRALAX) packet Take 17 g by mouth daily as needed for mild constipation. Patient not taking: Reported on 10/04/2016 09/18/16   Debbe Odea, MD  Propylene Glycol-Glycerin (SOOTHE OP) Place 1 drop into both eyes daily.    Historical Provider, MD  vitamin A (VITAMIN A) 10000 UNIT capsule Take 10,000 Units by mouth daily. Take 2 tablets daily    Historical Provider, MD  Zinc 50 MG TABS Take 50 mg by mouth daily.    Historical  Provider, MD   Family History Family History  Problem Relation Age of Onset  . Cancer Brother     bladder and stomach  . Cancer Brother     pancreatic   Social History Social History  Substance Use Topics  . Smoking status: Former Smoker    Packs/day: 0.25    Years: 20.00    Types: Cigarettes    Quit date: 09/27/1974  . Smokeless tobacco: Never Used  . Alcohol use No    Allergies   Codeine; Dabigatran; Iodine; Penicillins; and Sulfa antibiotics  Review of Systems Review of Systems  Constitutional: Positive for appetite change (decreased). Negative for chills and fever.  Respiratory: Negative for shortness of breath.   Cardiovascular: Negative for chest pain.  Gastrointestinal: Positive for abdominal pain and nausea. Negative for vomiting.  Neurological: Negative for headaches.  All other systems reviewed and are negative.   Physical Exam Updated Vital Signs BP 137/71   Pulse 81   Temp 97.7 F (36.5 C) (Oral)   Resp 18   Ht '5\' 7"'$  (1.702 m)   Wt 81.6 kg   SpO2 94%   BMI 28.19 kg/m   Physical Exam  Constitutional: She is oriented to person, place, and time. No distress.  Elderly obese Caucasian female  HENT:  Head: Normocephalic and atraumatic.  Eyes: EOM are normal. Pupils are equal, round, and reactive to light.  Neck: Normal range of motion. Neck supple.  Cardiovascular: Normal rate, regular rhythm and normal heart sounds.   Pulmonary/Chest: Effort normal and breath sounds normal.  Abdominal: Soft. Bowel sounds are normal. She exhibits distension. There is tenderness.  Musculoskeletal: Normal range of motion.  Neurological: She is alert and oriented to person, place, and time.  Skin: Skin is warm and dry. Capillary refill takes less than 2 seconds. She is not diaphoretic.  Nursing note and vitals reviewed.   ED Treatments / Results  Labs (all labs ordered are listed, but only abnormal results are displayed) Labs Reviewed  CBC WITH  DIFFERENTIAL/PLATELET - Abnormal; Notable for the following:       Result Value   WBC 15.1 (*)    RBC 5.63 (*)    Hemoglobin 15.7 (*)    RDW 17.3 (*)    Platelets 96 (*)    Neutro Abs 13.9 (*)    Lymphs Abs 0.6 (*)    All other components within normal limits  COMPREHENSIVE METABOLIC PANEL - Abnormal; Notable for the following:    Sodium 129 (*)    Chloride 92 (*)    Glucose, Bld 217 (*)    BUN 22 (*)    Calcium 8.5 (*)    Total Protein 4.5 (*)    Albumin 1.7 (*)    All other components within normal limits  I-STAT CHEM 8, ED - Abnormal; Notable for  the following:    Sodium 130 (*)    Chloride 91 (*)    BUN 23 (*)    Glucose, Bld 216 (*)    Calcium, Ion 1.05 (*)    Hemoglobin 16.0 (*)    HCT 47.0 (*)    All other components within normal limits  URINE CULTURE  LIPASE, BLOOD  URINALYSIS, ROUTINE W REFLEX MICROSCOPIC   EKG  EKG Interpretation None      Radiology No results found.  Procedures Procedures (including critical care time)  Medications Ordered in ED Medications  sodium chloride 0.9 % bolus 1,000 mL (not administered)  ondansetron (ZOFRAN) injection 4 mg (4 mg Intravenous Given 10/23/16 2347)  fentaNYL (SUBLIMAZE) injection 25 mcg (25 mcg Intravenous Given 10/23/16 2350)  iopamidol (ISOVUE-300) 61 % injection 100 mL (100 mLs Intravenous Contrast Given 10/24/16 0007)    Initial Impression / Assessment and Plan / ED Course  I have reviewed the triage vital signs and the nursing notes.  83 y.o. female with above stated PMHx, HPI, and physical. Patient had ground-level fall in mid December. Possible history of atrial fibrillation on several toe. At that time CT scan showed right frontal mass which was concerning for primary versus metastasis. CT cervical spine at that time showed a C1 fracture for which neurosurgery was consulted and patient was kept in a cervical collar. CT scan showed lung nodule which was biopsied and confirmed as adenocarcinoma. Patient is  now presenting with abdominal pain onset over the past several hours. Generalized. Progressive worsening. Associated with nausea and anorexia. No bowel movement or passage of flatus since onset of pain  Patient afebrile. Normotensive. Not tachycardic. WBC elevated to 15. CT abdomen and pelvis with contrast ordered & pending. Laboratory results were personally reviewed by myself and used in the medical decision making of this patient's treatment and disposition. Sign out given to on-coming ER physician. Please refer to their note for final CT results and disposition. Pt understands and agrees with the plan and has no further questions or concerns.   Pt care discussed with and followed by my attending, Dr. Carmin Muskrat  Mayer Camel, MD Pager [phone]  Final Clinical Impressions(s) / ED Diagnoses   Final diagnoses:  Generalized abdominal pain  Leukocytosis   New Prescriptions New Prescriptions   No medications on file     Mayer Camel, MD 10/25/16 [id number]    Carmin Muskrat, MD November 06, 2016 1538

## 2016-10-24 ENCOUNTER — Emergency Department (HOSPITAL_COMMUNITY): Payer: Medicare HMO

## 2016-10-24 ENCOUNTER — Encounter (HOSPITAL_COMMUNITY): Payer: Self-pay | Admitting: Radiology

## 2016-10-24 LAB — TYPE AND SCREEN
ABO/RH(D): A POS
Antibody Screen: NEGATIVE

## 2016-10-24 LAB — APTT: APTT: 24 s (ref 24–36)

## 2016-10-24 LAB — ABO/RH: ABO/RH(D): A POS

## 2016-10-24 LAB — PROTIME-INR
INR: 1.26
Prothrombin Time: 15.9 seconds — ABNORMAL HIGH (ref 11.4–15.2)

## 2016-10-24 MED ORDER — GENTAMICIN SULFATE 40 MG/ML IJ SOLN
7.0000 mg/kg | Freq: Once | INTRAVENOUS | Status: DC
  Filled 2016-10-24: qty 14.25

## 2016-10-24 MED ORDER — OXYCODONE HCL 5 MG/5ML PO SOLN: 5.0000 mg | ORAL | 0 refills | Status: AC | PRN

## 2016-10-24 MED ORDER — IOPAMIDOL (ISOVUE-300) INJECTION 61%
100.0000 mL | Freq: Once | INTRAVENOUS | Status: AC | PRN
  Administered 2016-10-24: 100 mL via INTRAVENOUS

## 2016-10-24 MED ORDER — FENTANYL CITRATE (PF) 100 MCG/2ML IJ SOLN
100.0000 ug | Freq: Once | INTRAMUSCULAR | Status: AC
  Administered 2016-10-24: 100 ug via INTRAVENOUS
  Filled 2016-10-24: qty 2

## 2016-10-24 MED ORDER — ONDANSETRON 8 MG PO TBDP: ORAL_TABLET | ORAL | 0 refills | Status: AC

## 2016-10-24 MED ORDER — METRONIDAZOLE IN NACL 5-0.79 MG/ML-% IV SOLN
500.0000 mg | Freq: Once | INTRAVENOUS | Status: DC
  Filled 2016-10-24: qty 100

## 2016-10-24 MED ORDER — CIPROFLOXACIN IN D5W 400 MG/200ML IV SOLN
400.0000 mg | Freq: Once | INTRAVENOUS | Status: DC
  Filled 2016-10-24: qty 200

## 2016-10-24 MED ORDER — SODIUM CHLORIDE 0.9 % IV BOLUS (SEPSIS)
1000.0000 mL | Freq: Once | INTRAVENOUS | Status: AC
  Administered 2016-10-24: 1000 mL via INTRAVENOUS

## 2016-10-24 NOTE — Consult Note (Signed)
Reason for Consult: free air in abdomen Referring Physician: [name], [name] is an 83 y.o. female.  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 Pain is constant and feels like pressure. She denies vomiting. She has been undergoing radiation to the brain and is set to see a medical oncologist in February for possible chemotherapy.  Past Medical History:  Diagnosis Date  . A-fib ([place])   . Adenocarcinoma of right lung, stage 4 ([place]) 09/30/2016  . Brain cancer ([place])   . Coronary artery disease   . Hypertension   . MI (myocardial infarction)   . Skin cancer   . Subdural hematoma ([place]) 09/13/2016  . Uterine cancer [facility])     Past Surgical History:  Procedure Laterality Date  . ABDOMINAL HYSTERECTOMY    . APPENDECTOMY    . CORONARY ANGIOPLASTY WITH STENT PLACEMENT    . REPLACEMENT TOTAL KNEE    . TONSILLECTOMY      Family History  Problem Relation Age of Onset  . Cancer Brother     bladder and stomach  . Cancer Brother     pancreatic    Social History:  reports that she quit smoking about 42 years ago. Her smoking use included Cigarettes. She has a 5.00 pack-year smoking history. She has never used smokeless tobacco. She reports that she does not drink alcohol or use drugs.  Allergies:  Allergies  Allergen Reactions  . Codeine Other (See Comments)    Possibly hives  . Dabigatran Hives    Reaction to Pradaxa  . Iodine Hives    Reaction to topical iodine  . Penicillins Hives    Has patient had a PCN reaction causing immediate rash, facial/tongue/throat swelling, SOB or lightheadedness with hypotension: Yes Has patient had a PCN reaction causing severe rash involving mucus membranes or skin necrosis: No Has patient had a PCN reaction that required hospitalization No Has patient had a PCN reaction occurring within the last 10 years: No If all of  the above answers are "NO", then may proceed with Cephalosporin use.  . Sulfa Antibiotics Nausea And Vomiting    Medications: I have reviewed the patient's current medications.  Results for orders placed or performed during the hospital encounter of 10/23/16 (from the past 48 hour(s))  CBC with Differential     Status: Abnormal   Collection Time: 10/23/16 10:42 PM  Result Value Ref Range   WBC 15.1 (H) 4.0 - 10.5 K/uL   RBC 5.63 (H) 3.87 - 5.11 MIL/uL   Hemoglobin 15.7 (H) 12.0 - 15.0 g/dL   HCT 44.9 36.0 - 46.0 %   MCV 79.8 78.0 - 100.0 fL   MCH 27.9 26.0 - 34.0 pg   MCHC 35.0 30.0 - 36.0 g/dL   RDW 17.3 (H) 11.5 - 15.5 %   Platelets 96 (L) 150 - 400 K/uL    Comment: REPEATED TO VERIFY SPECIMEN CHECKED FOR CLOTS PLATELET COUNT CONFIRMED BY SMEAR    Neutrophils Relative % 92 %   Lymphocytes Relative 4 %   Monocytes Relative 4 %   Eosinophils Relative 0 %   Basophils Relative 0 %   Neutro Abs 13.9 (H) 1.7 - 7.7 K/uL   Lymphs Abs 0.6 (L) 0.7 - 4.0 K/uL   Monocytes Absolute 0.6 0.1 - 1.0 K/uL   Eosinophils Absolute 0.0 0.0 - 0.7 K/uL   Basophils Absolute  0.0 0.0 - 0.1 K/uL   WBC Morphology MILD LEFT SHIFT (1-5% METAS, OCC MYELO, OCC BANDS)     Comment: TOXIC GRANULATION   Smear Review LARGE PLATELETS PRESENT   Comprehensive metabolic panel     Status: Abnormal   Collection Time: 10/23/16 10:42 PM  Result Value Ref Range   Sodium 129 (L) 135 - 145 mmol/L   Potassium 3.8 3.5 - 5.1 mmol/L   Chloride 92 (L) 101 - 111 mmol/L   CO2 23 22 - 32 mmol/L   Glucose, Bld 217 (H) 65 - 99 mg/dL   BUN 22 (H) 6 - 20 mg/dL   Creatinine, Ser 0.73 0.44 - 1.00 mg/dL   Calcium 8.5 (L) 8.9 - 10.3 mg/dL   Total Protein 4.5 (L) 6.5 - 8.1 g/dL   Albumin 1.7 (L) 3.5 - 5.0 g/dL   AST 25 15 - 41 U/L   ALT 26 14 - 54 U/L   Alkaline Phosphatase 75 38 - 126 U/L   Total Bilirubin 0.7 0.3 - 1.2 mg/dL   GFR calc non Af Amer >60 >60 mL/min   GFR calc Af Amer >60 >60 mL/min    Comment: (NOTE) The  eGFR has been calculated using the CKD EPI equation. This calculation has not been validated in all clinical situations. eGFR's persistently <60 mL/min signify possible Chronic Kidney Disease.    Anion gap 14 5 - 15  Lipase, blood     Status: None   Collection Time: 10/23/16 10:42 PM  Result Value Ref Range   Lipase 21 11 - 51 U/L  I-Stat Chem 8, ED     Status: Abnormal   Collection Time: 10/23/16 10:51 PM  Result Value Ref Range   Sodium 130 (L) 135 - 145 mmol/L   Potassium 3.8 3.5 - 5.1 mmol/L   Chloride 91 (L) 101 - 111 mmol/L   BUN 23 (H) 6 - 20 mg/dL   Creatinine, Ser 0.70 0.44 - 1.00 mg/dL   Glucose, Bld 216 (H) 65 - 99 mg/dL   Calcium, Ion 1.05 (L) 1.15 - 1.40 mmol/L   TCO2 28 0 - 100 mmol/L   Hemoglobin 16.0 (H) 12.0 - 15.0 g/dL   HCT 47.0 (H) 36.0 - 46.0 %    Ct Abdomen Pelvis W Contrast  Result Date: 10/24/2016 CLINICAL DATA:  83 year old female with abdominal pain and constipation. Evaluate for small-bowel obstruction. History of appendectomy and hysterectomy. EXAM: CT ABDOMEN AND PELVIS WITH CONTRAST TECHNIQUE: Multidetector CT imaging of the abdomen and pelvis was performed using the standard protocol following bolus administration of intravenous contrast. CONTRAST:  149m ISOVUE-300 IOPAMIDOL (ISOVUE-300) INJECTION 61% COMPARISON:  Abdominal CT dated 10/18/2015. FINDINGS: Lower chest: Partially visualized 3.5 x 3.2 cm right lung base soft tissue mass as seen on the prior CT and previously biopsied. There is occlusion of the distal right lower lobe bronchi. There is an 8 mm nodular density in the right middle lobe (series 205 image 10) which is new compared to the prior CT and concerning for metastatic disease. There is stable cardiomegaly. Multi vessel coronary vascular calcification noted. There is scattered pockets of free air within the abdomen. Diffuse mesenteric stranding and small ascites. Hepatobiliary: The liver is unremarkable. No intrahepatic biliary ductal  dilatation. The gallbladder is predominantly contracted and appears unremarkable. Pancreas: Unremarkable. No pancreatic ductal dilatation or surrounding inflammatory changes. Spleen: A 1.7 cm early enhancing lesion in the spleen with delayed iso-enhancement to the splenic parenchyma similar to the prior CT most consistent with  a hemangioma. Adrenals/Urinary Tract: The adrenal glands appear unremarkable. Mild bilateral renal atrophy and cortical thinning. Bilateral renal hypodense lesions again noted similar to prior CT. Some of these lesions demonstrate fluid attenuation consistent with cysts. Higher attenuating lesions are indeterminate but may represent hemorrhagic cyst. These lesions do not demonstrate significant change in attenuation on the delayed phase. Nonemergent MRI may provide better characterisation if clinically indicated. There is no hydronephrosis on either side. The visualized ureters and urinary bladder appear unremarkable. Stomach/Bowel: There is extensive colonic diverticulosis. There is apparent diffuse thickening of the colon. This segment of the sigmoid colon which is mildly distended with air with loss of haustra markings. The stomach is distended with fluid content. There is mild dilatation of the duodenal loops. No definite evidence of bowel obstruction. The appendix is not visualized with certainty. Vascular/Lymphatic: There is advanced aortoiliac atherosclerotic disease plaques. The origins of the celiac axis, SMA, IMA and the renal arteries appear patent. The splenic vein, SMV, and main portal vein are patent. No portal venous gas identified. There is no adenopathy. Reproductive: Hysterectomy. Other: Multiple loculated fluid collections noted within the left hemipelvis. There is a 4.5 x 6.2 cm fluid collection abutting the lateral aspect of the descending/sigmoid junction. A 2.7 x 10.5 cm collection containing air-fluid level noted in the left anterior hemipelvis. Musculoskeletal: Diffuse  subcutaneous stranding. Osteopenia with multilevel degenerative changes of the spine. Stable appearing compression deformity of the lumbar vertebra and at disc desiccation noted. No acute fracture. IMPRESSION: Pneumoperitoneum most consistent with bowel perforation of indeterminate origin. There is extensive colonic diverticulosis with thickened appearance of the colon. Although the amount of free air is somewhat greater than usually seen with diverticular perforation, the possibility of a perforated diverticulum is not excluded. Clinical correlation and surgical consult is advised. No bowel obstruction. Small ascites, diffuse mesenteric stranding and edema. Small loculated fluid collection in the left hemipelvis. Partially visualized right lower lobe soft tissue mass as previously seen and biopsy. An 8 mm nodular density in the right middle lobe appears mural since the prior studies and concerning for metastatic disease. These results were called by telephone at the time of interpretation on 10/24/2016 at 1:36 am to Dr. Christy Gentles who verbally acknowledged these results. Electronically Signed   By: Anner Crete M.D.   On: 10/24/2016 01:40    Review of Systems  Constitutional: Positive for malaise/fatigue and weight loss. Negative for chills and fever.  HENT: Negative for hearing loss.   Eyes: Negative for blurred vision and double vision.  Respiratory: Positive for cough. Negative for hemoptysis.   Cardiovascular: Negative for chest pain and palpitations.  Gastrointestinal: Positive for abdominal pain, nausea and vomiting.  Genitourinary: Negative for dysuria and urgency.  Musculoskeletal: Negative for myalgias and neck pain.  Skin: Negative for itching and rash.  Neurological: Positive for weakness. Negative for dizziness, tingling and headaches.  Endo/Heme/Allergies: Does not bruise/bleed easily.  Psychiatric/Behavioral: Negative for depression and suicidal ideas.   Blood pressure 114/63, pulse  70, temperature 97.7 F (36.5 C), temperature source Oral, resp. rate 18, height _0  (1.702 m), weight 81.6 kg (180 lb), SpO2 95 %. Physical Exam  Vitals reviewed. Constitutional: She is oriented to person, place, and time. She appears well-developed.  HENT:  Head: Normocephalic and atraumatic.  Eyes: Conjunctivae and EOM are normal. Pupils are equal, round, and reactive to light.  Neck: Normal range of motion. Neck supple.  Cardiovascular: Normal rate and regular rhythm.   Respiratory: Effort normal and breath sounds  normal.  GI: Soft. Bowel sounds are normal. She exhibits no distension. There is generalized tenderness. There is guarding.  Musculoskeletal: Normal range of motion.  Neurological: She is alert and oriented to person, place, and time.  Skin: Skin is warm and dry.  Psychiatric: She has a normal mood and affect. Her behavior is normal.    Assessment/Plan: 83 yo female with free air and fluid collections as well as diverticula and inflammatory changes of the sigmoid colon. She previously had declined surgery for her lung mass at the time of diagnosis and has also completed DNR paperwork. I think she likely has perforated bowel most likely diverticular in nature with free fluid and air causing an acute infection. -We discussed that the most common treatment is for emergent exploration with resection of perforated bowel. We discussed that this would likely result in an ostomy and a prolonged hospitalization with multiple possible complications -We discussed that not undergoing surgery for this disease would likely results in rapid progression to death by sepsis -We also gave the option to treatment with antibiotics to see if the disease improves -Due to the multiple problems she has developed within the last 2 months beginning with a traumatic fall leading to the diagnosis of stage IV lung cancer. She does not feel she would tolerate surgery well both because of her other diseases  and because she feels weak. Therefore, she has opted to not be admitted to the hospital or to undergo emergent surgery. She is interested in hospice care at this time. Due to her advanced lung cancer diagnosis, age/frailty, and other comorbidities, I think this is an appropriate decision for her to make.  Arta Bruce Miranda Frese 10/24/2016, 2:26 AM

## 2016-10-24 NOTE — ED Notes (Signed)
Patient transported to CT 

## 2016-10-24 NOTE — ED Provider Notes (Signed)
Seen by dr Kieth Brightly, appreciate consult/input Pt has elected to leave hospital and have hospice involved She does not want further care/treatment/surgery She is awake/alert, answers questions appropriately and is able to make her own decisions She understands this diagnosis is fatal, and she is comfortable with not electing aggressive treatment and having hospice involved I spoke to hospice on call nurse with [facility] They will come to [place] where patient lives, likely by 10am today Pt/family updated on plan She still elects to go home Short course of pain meds/zofran given at discharge for patient to use prior to hospice arrival    Ripley Fraise, MD 10/24/16 [phone]

## 2016-10-24 NOTE — ED Provider Notes (Signed)
D/w dr Kieth Brightly He is aware of case and will see surgery IV antibiotics ordered I have spoken to patient and family Pt denies current pain BP is stable/appropriate Awaiting surgical evaluation   CRITICAL CARE Performed by: Sharyon Cable Total critical care time: 31 minutes Critical care time was exclusive of separately billable procedures and treating other patients. Critical care was necessary to treat or prevent imminent or life-threatening deterioration. Critical care was time spent personally by me on the following activities: development of treatment plan with patient and/or surrogate as well as nursing, discussions with consultants, evaluation of patient's response to treatment, examination of patient, obtaining history from patient or surrogate, ordering and performing treatments and interventions, ordering and review of laboratory studies, ordering and review of radiographic studies, pulse oximetry and re-evaluation of patient's condition.    Ripley Fraise, MD 10/24/16 [phone]

## 2016-10-24 NOTE — ED Provider Notes (Signed)
At signout, plan to f/u on Ct imaging and probable admission    Ripley Fraise, MD 10/24/16 0104

## 2016-10-24 NOTE — Discharge Instructions (Signed)
[facility] [phone] ASK FOR ON CALL NURSE TO ASSIST WITH ANY NEEDS THEY SHOULD BE AT [facility] BY 10AM TODAY  REMEMBER YOU CAN RETURN AT ANYTIME IF YOU CHOOSE TO GO FORWARD WITH THE TREATMENT WE DISCUSSED

## 2016-10-24 NOTE — ED Provider Notes (Signed)
I was called by radiology as pt has pneumoperitoneum on ct imaging I immediately paged surgery IV antibiotics ordered Labs ordered    Ripley Fraise, MD 10/24/16 0140

## 2016-10-26 ENCOUNTER — Telehealth: Payer: Self-pay | Admitting: Internal Medicine

## 2016-10-26 ENCOUNTER — Other Ambulatory Visit: Payer: Self-pay | Admitting: Medical Oncology

## 2016-10-27 ENCOUNTER — Ambulatory Visit (HOSPITAL_COMMUNITY): Payer: Medicare HMO

## 2016-10-27 ENCOUNTER — Other Ambulatory Visit: Payer: Medicare HMO

## 2016-10-28 NOTE — Telephone Encounter (Signed)
A nurse from [facility] called and said that Ms Welch passed away this morning at 9:37

## 2016-10-28 DEATH — deceased

## 2016-11-02 ENCOUNTER — Ambulatory Visit: Payer: Medicare HMO | Admitting: Internal Medicine

## 2016-11-24 ENCOUNTER — Ambulatory Visit: Payer: Self-pay | Admitting: Radiation Oncology

## 2018-02-25 IMAGING — CT CT MAXILLOFACIAL W/O CM
3 series · 14 of 47 positions shown, 16 images · non-contrast
Comparison: Head CT dated 02/24/2009

CLINICAL DATA: 86-year-old female with dizziness and fall. History
of breast cancer.

EXAM:
CT HEAD WITHOUT CONTRAST
CT MAXILLOFACIAL WITHOUT CONTRAST
CT CERVICAL SPINE WITHOUT CONTRAST
TECHNIQUE: Multidetector CT imaging of the head, cervical spine, and
maxillofacial structures were performed using the standard protocol
without intravenous contrast. Multiplanar CT image reconstructions
of the cervical spine and maxillofacial structures were also
generated.

[Series 2: head 5.0 h30s · axial · 0.43mm/px · z∈[-121,+4]mm · 8 of 31 slices shown, 10 images]
[im 3/31  brain]
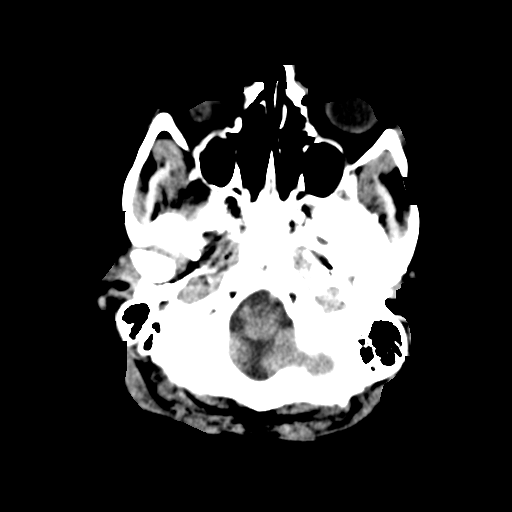
[im 3/31  bone]
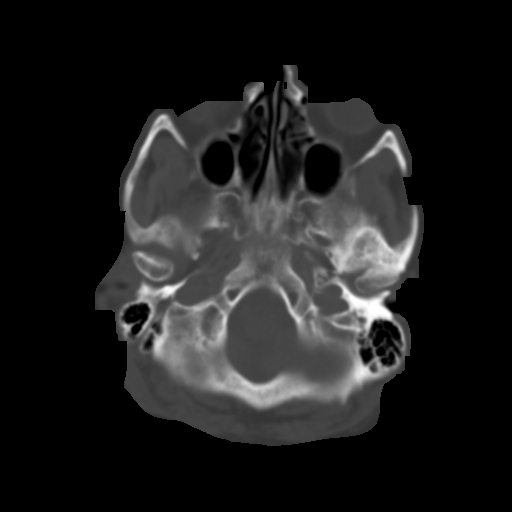
[im 7/31  bone]
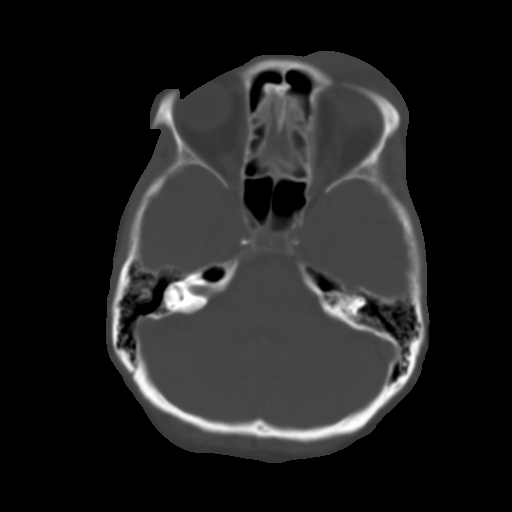
[im 10/31  bone]
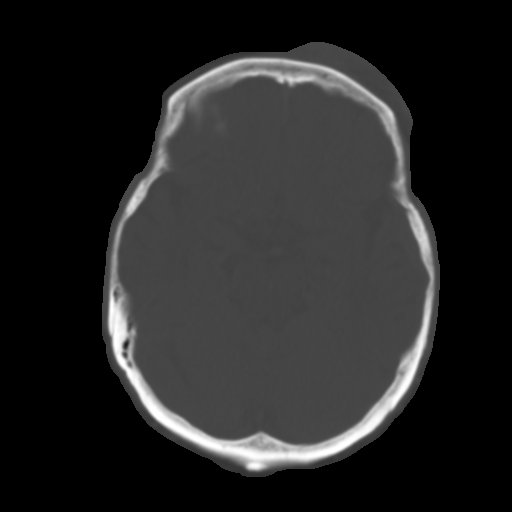
[im 14/31  bone]
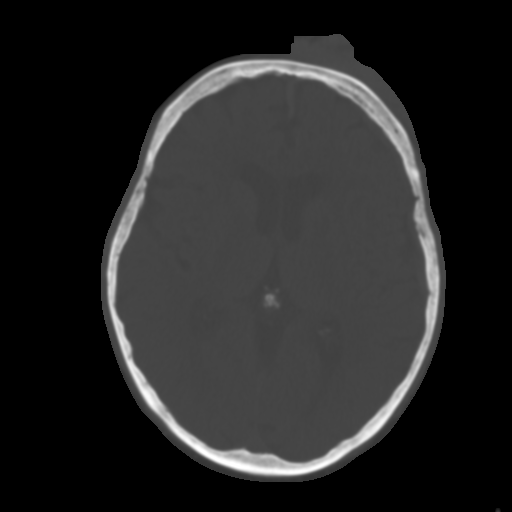
[im 17/31  brain]
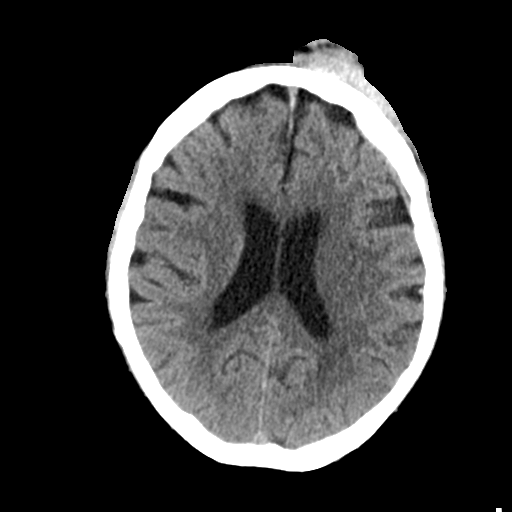
[im 17/31  bone]
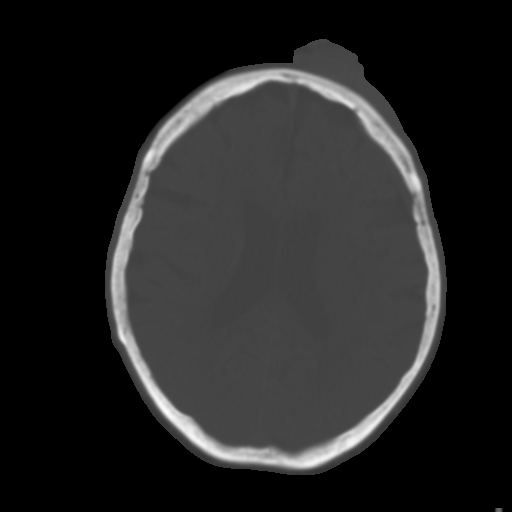
[im 21/31  bone]
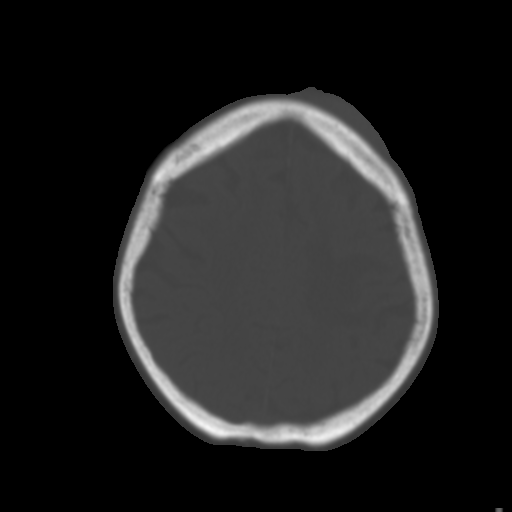
[im 24/31  bone]
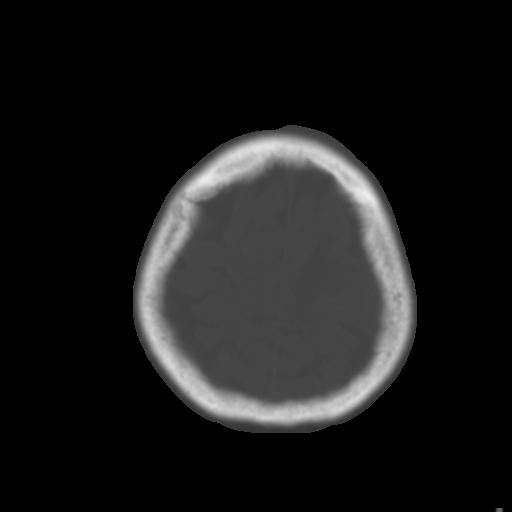
[im 28/31  bone]
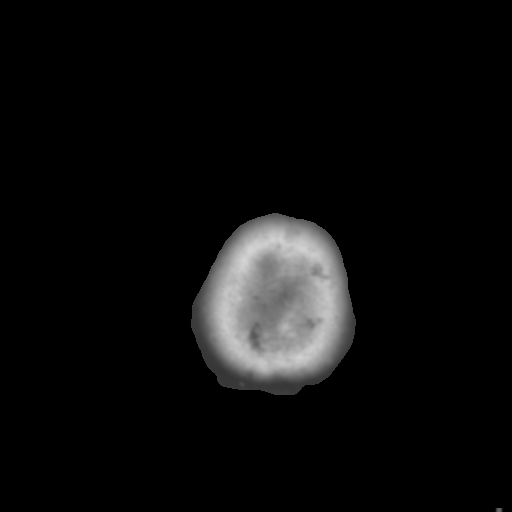

[Series 4: head 3.0 mpr cor · coronal · 0.31mm/px · 3 of 63 slices shown]
[im 21/63  bone]
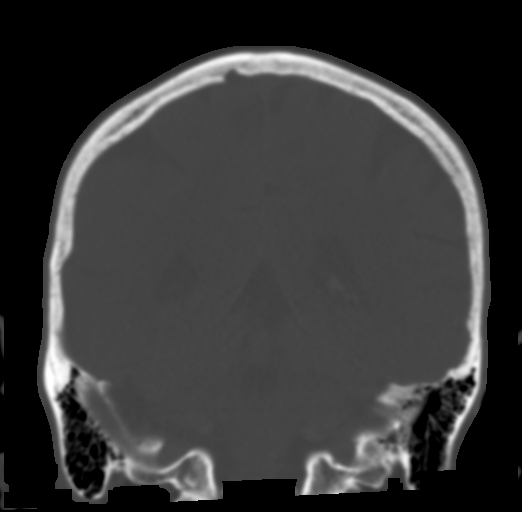
[im 28/63  bone]
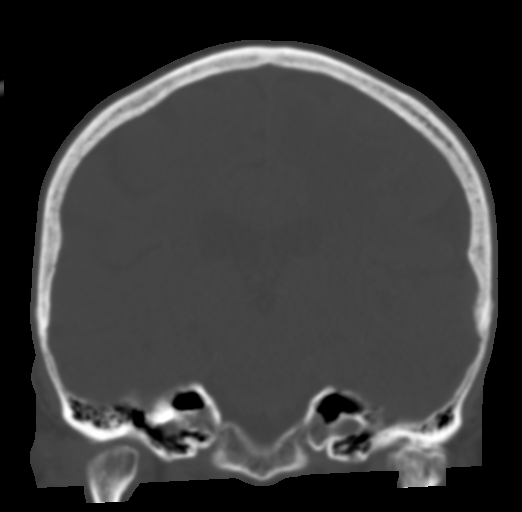
[im 35/63  bone]
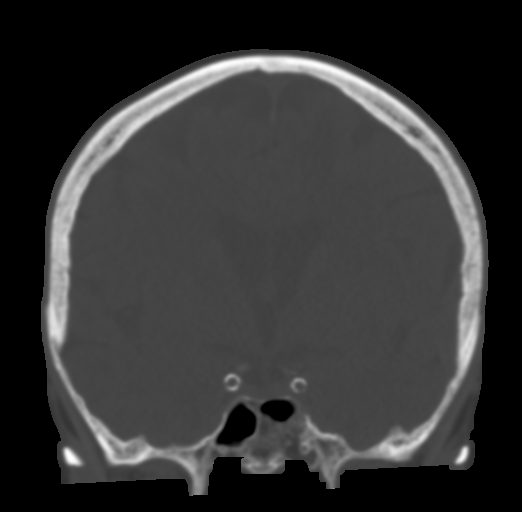

[Series 5: head 3.0 mpr sag · sagittal · 0.29mm/px · 3 of 55 slices shown]
[im 19/55  bone]
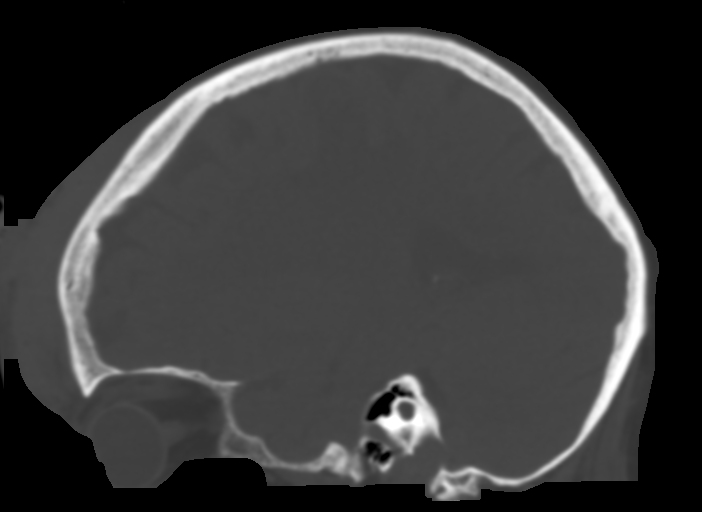
[im 28/55  bone]
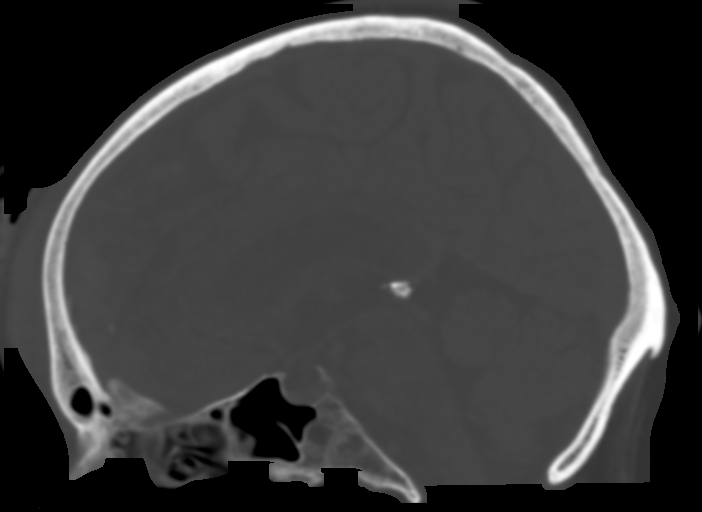
[im 37/55  bone]
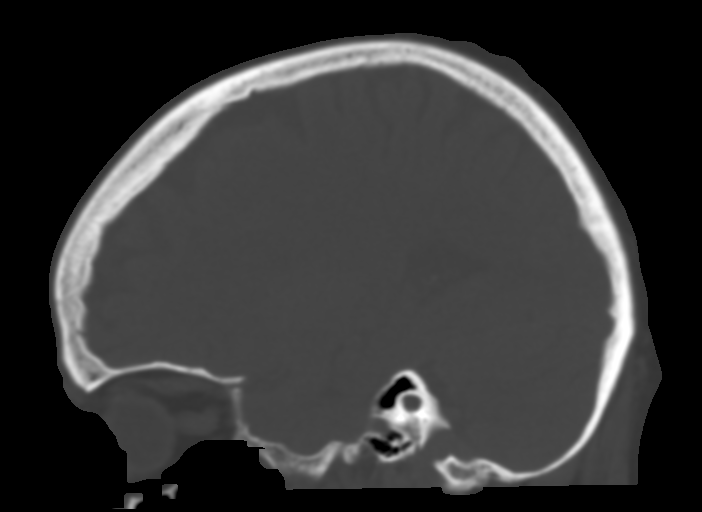

[14 of 47 positions shown; findings below may reference images not displayed]

FINDINGS: CT HEAD FINDINGS

Brain: There is a left frontotemporal subdural hemorrhage measuring
up to 4 mm in thickness. Right anterior parafalcine subdural
hemorrhage measures 4 mm in thickness. An area of white matter edema
noted over the left frontal convexity compatible with vasogenic
edema. There is mild mass effect and effacement of the adjacent
sulci. A 1.2 x 0.9 cm density in the left frontal convexity (series
2, image 23 and sagittal series 5 image 41) is concerning for a
primary neoplasm versus metastatic disease. Further evaluation with
MRI without and with contrast is recommended. There is mild
age-related atrophy and chronic microvascular ischemic changes. No
midline shift noted. The quadrigeminal plate cistern is patent.

Vascular: No hyperdense vessel or unexpected calcification.

Skull: Nondisplaced fracture of the right occipital condyle. No
other calcaneal fracture.

Other: Large left forehead hematoma.

CT MAXILLOFACIAL FINDINGS

Osseous: No fracture or mandibular dislocation. No destructive
process. Chronic degenerative changes at the left TMJ.

Orbits: Bilateral cataract surgeries noted. The globes and
retro-orbital fat are preserved.

Sinuses: Clear.

Soft tissues: Left forehead hematoma.

CT CERVICAL SPINE FINDINGS

Alignment: No acute subluxation.

Skull base and vertebrae: There is nondisplaced comminuted appearing
fracture of the right occipital condyle. There is nondisplaced
fracture of the right posterior arch of C1. There is incomplete bony
fusion of the posterior ring of C1. There is type III fracture of
the base of the odontoid process with approximately 2 mm posterior
displacement of the tip of the odontoid. Evaluation for fracture is
very limited due to advanced osteopenia. There is apparent ankylosis
of the C2-C5 posterior elements related to chronic changes and
inflammation.

Soft tissues and spinal canal: No prevertebral fluid or swelling. No
visible canal hematoma.

Disc levels:  Multilevel degenerative changes.

Upper chest: Bilateral carotid bulb atherosclerotic disease.

Other: None
IMPRESSION: Left frontal white matter vasogenic edema with suspected underlying
mass/metastatic disease. MRI without and with contrast is
recommended for further evaluation.

Small left frontotemporal subdural hemorrhage as well as anterior
parafalcine subdural hemorrhage. No midline shift.

Nondisplaced comminuted appearing fracture of the right occipital
condyle.

Nondisplaced fracture of the right posterior arch of C1.

Type III fracture of the odontoid process with 2 mm posterior
displacement.

These results were called by telephone at the time of interpretation
on 09/13/2016 at [DATE] to Dr. LIBNI KINSER , who verbally
acknowledged these results.

## 2018-02-25 IMAGING — DX DG TIBIA/FIBULA 2V*R*
4 series · 4 of 4 positions shown · non-contrast
Comparison: None.

CLINICAL DATA: Initial evaluation for acute trauma, fall.

EXAM:
RIGHT TIBIA AND FIBULA - 2 VIEW

[tibia ap (1 of 2)]
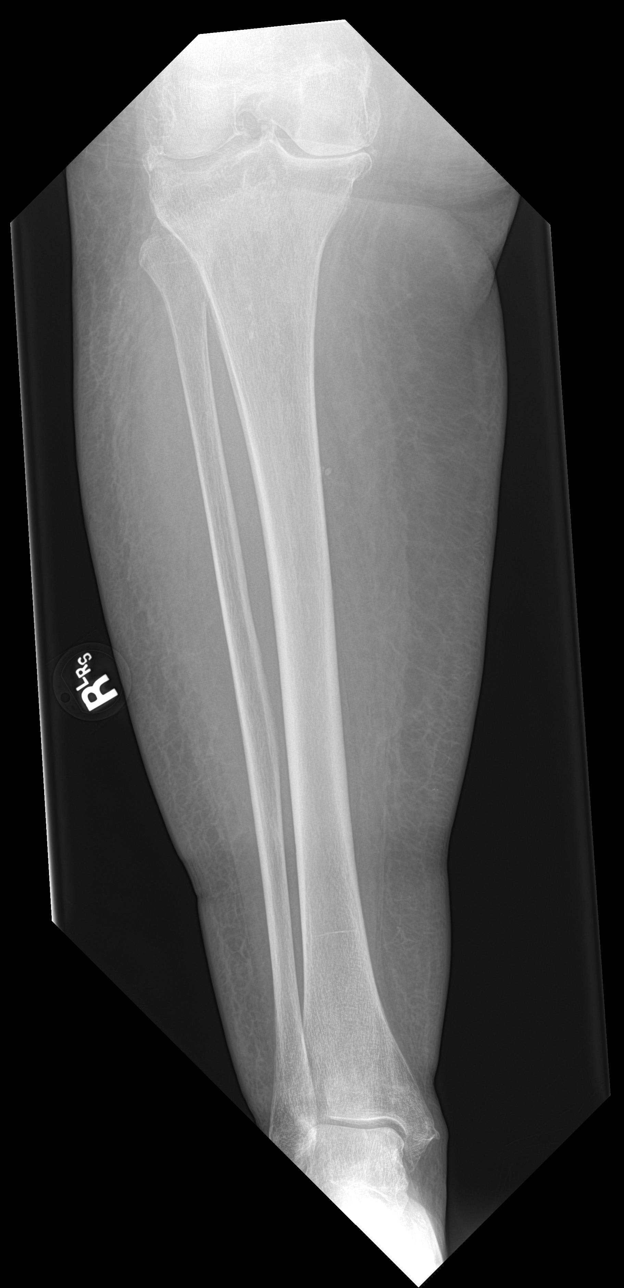

[tibia ap (2 of 2)]
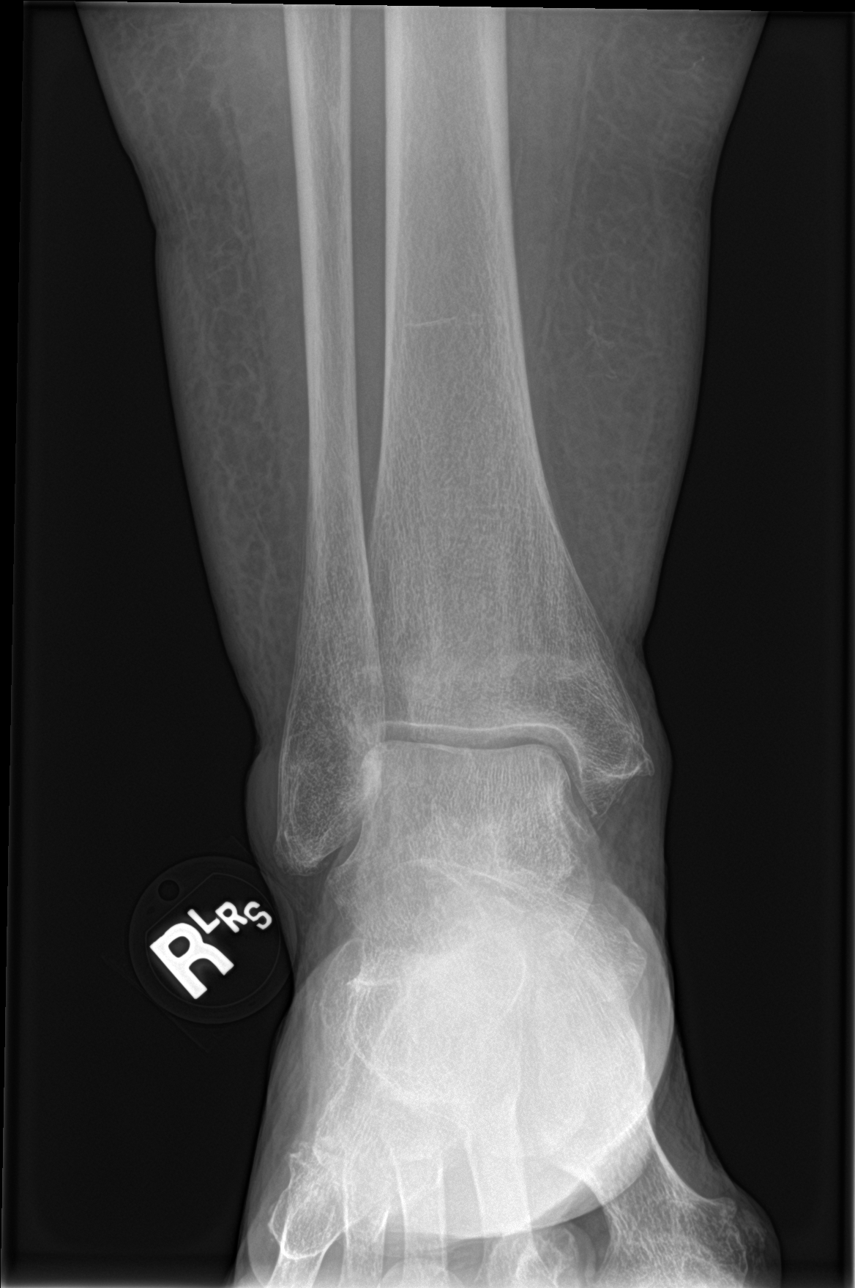

[tibia lat (1 of 2)]
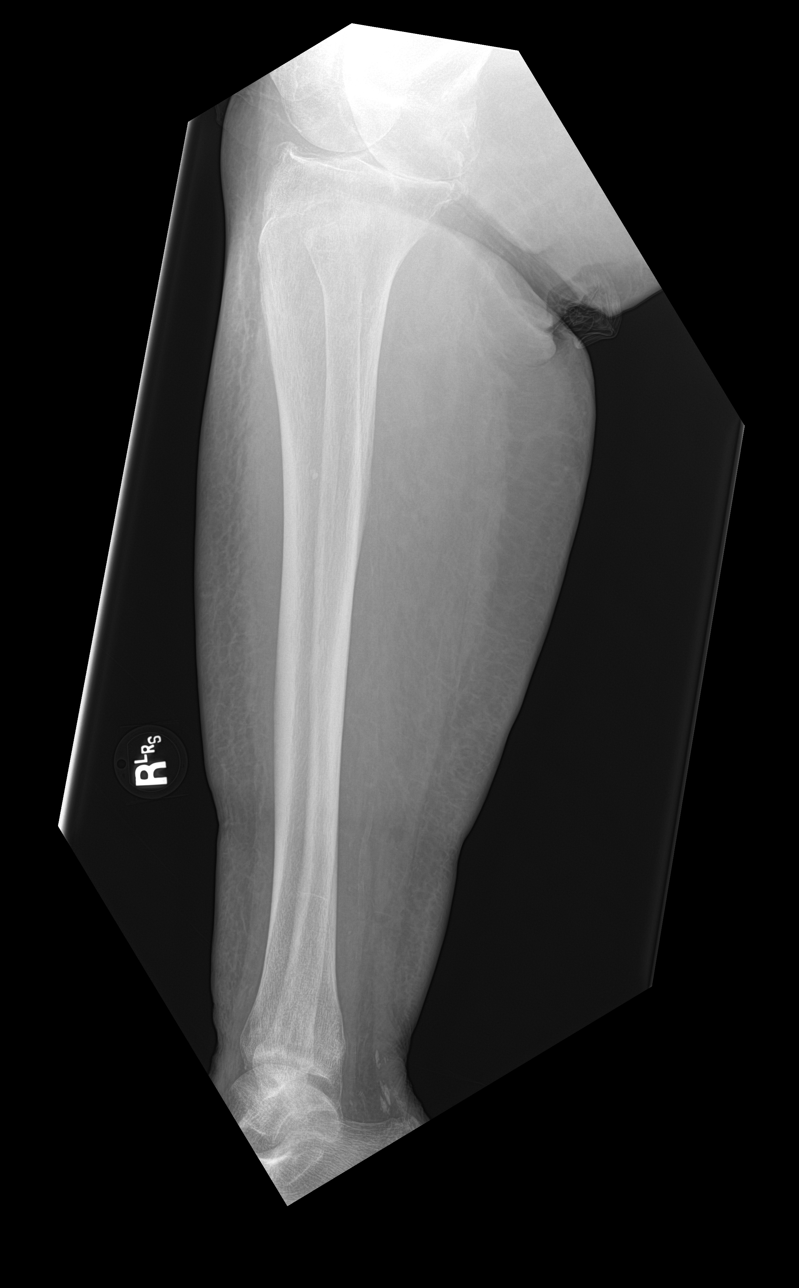

[tibia lat (2 of 2)]
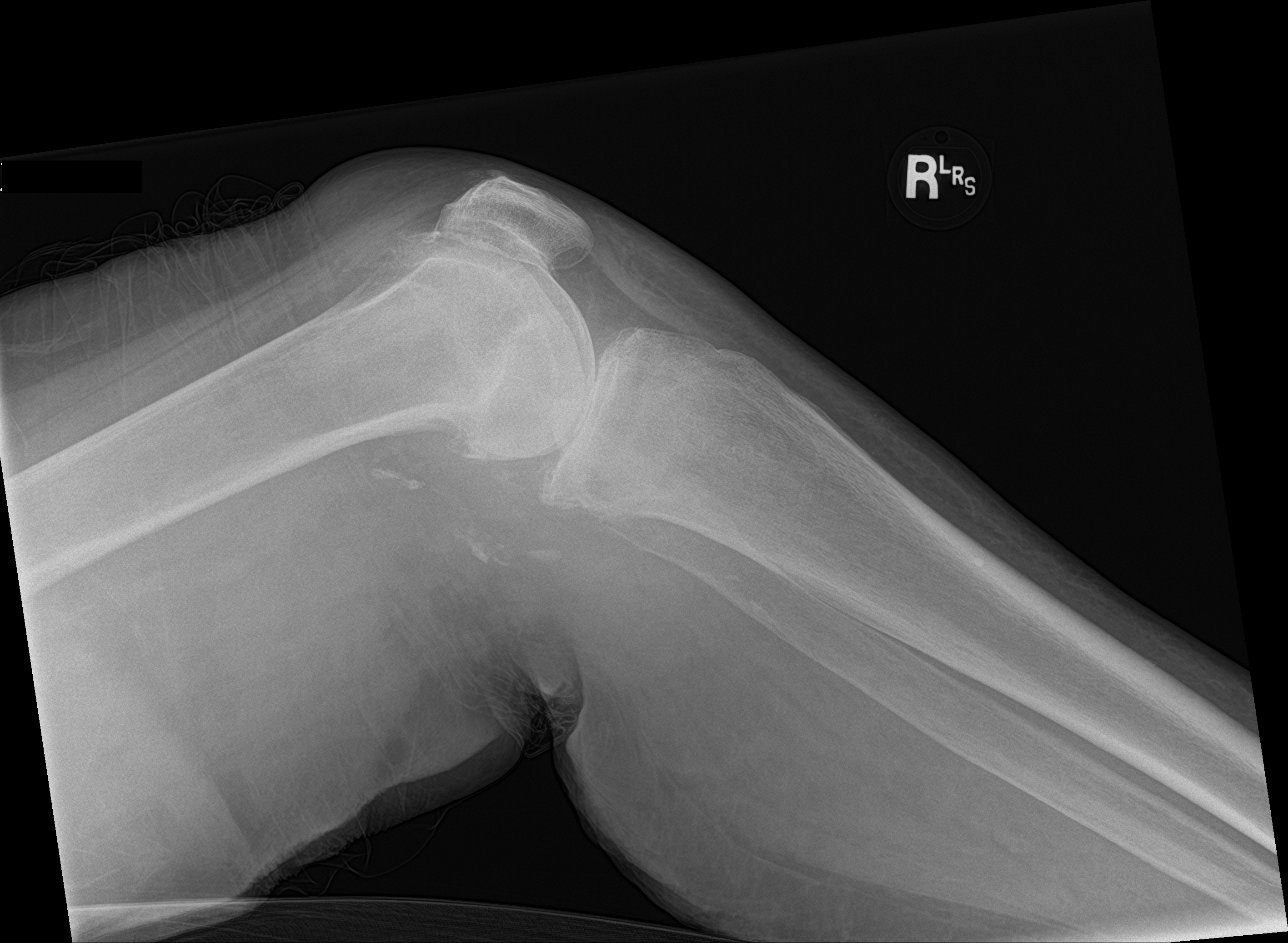

[4 of 4 positions shown; findings below may reference images not displayed]

FINDINGS: There is no evidence of fracture or other focal bone lesions. So no
acute soft tissue abnormality about the leg scattered atheromatous
vascular calcifications noted posterior to the knee. Advanced
degenerative changes noted about the partially visualized knee.
IMPRESSION: 1. No acute osseous abnormality about the right tibia/fibula.
2. Advanced degenerative osteoarthrosis about the right knee.
3. Atherosclerotic disease.

## 2018-02-25 IMAGING — DX DG CHEST 2V
2 series · 2 of 2 positions shown · non-contrast
Comparison: Prior radiograph from 01/01/2009.

CLINICAL DATA: Initial evaluation for acute dizziness, fall.

EXAM:
CHEST  2 VIEW

[chest lat]
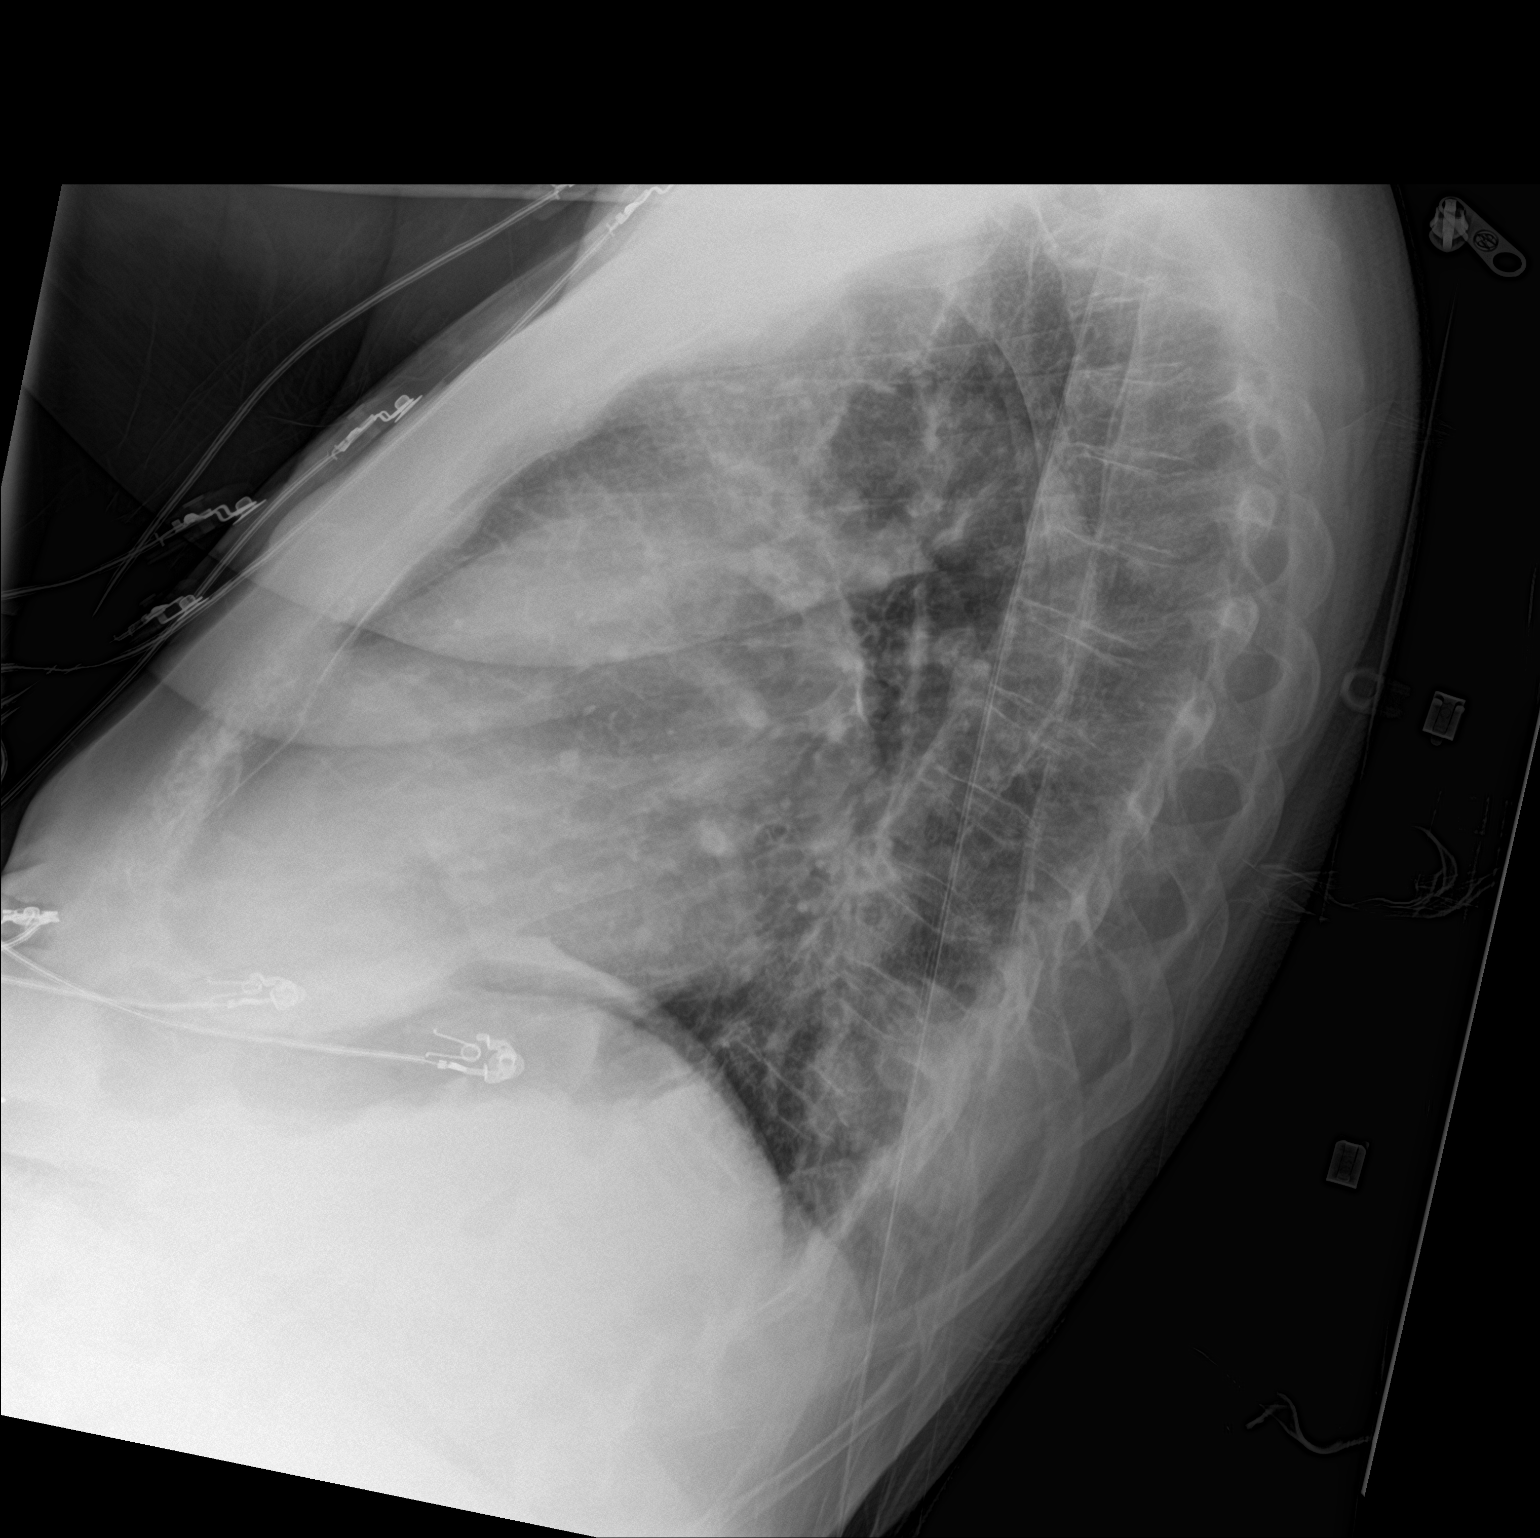

[chest ap]
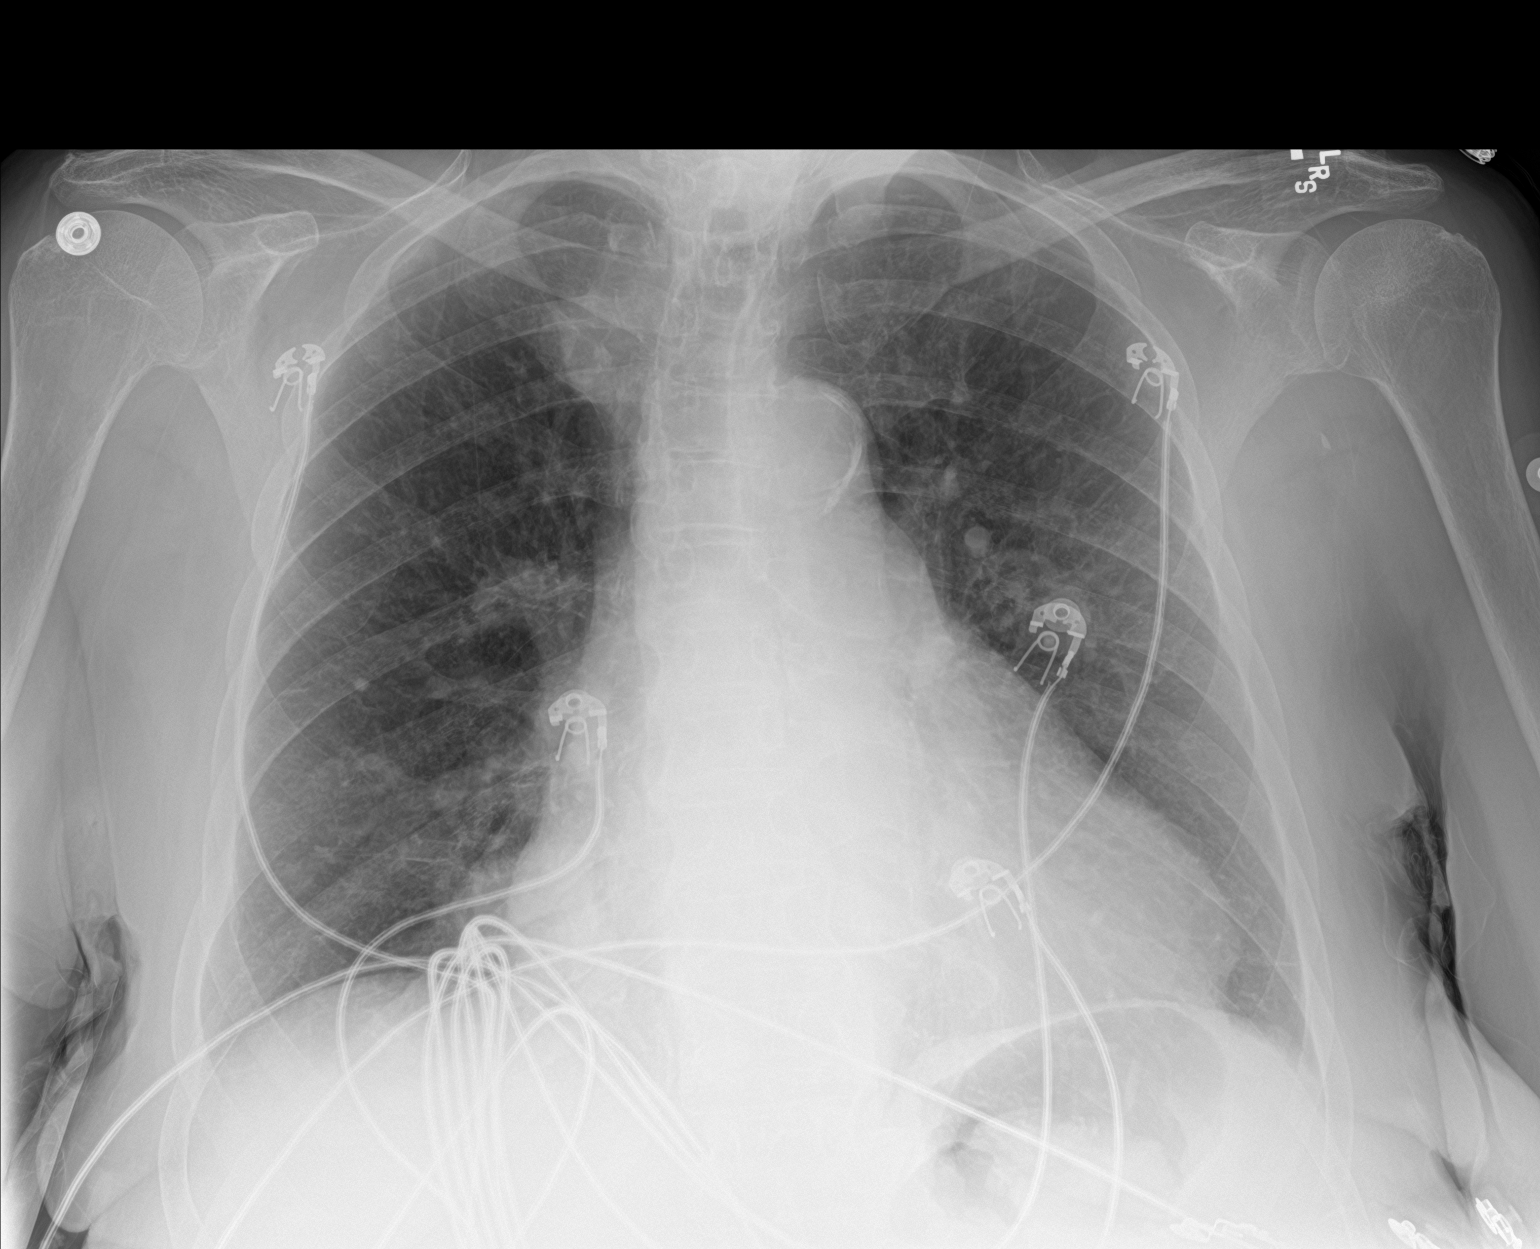

[2 of 2 positions shown; findings below may reference images not displayed]

FINDINGS: Moderate cardiomegaly, slightly progressed relative to most recent
radiograph from 4161. Mediastinal silhouette within normal limits.
Aortic atherosclerosis, also slightly progressed.

Lungs normally inflated. Mild diffuse interstitial prominence,
suggesting mild interstitial edema. No focal infiltrates. No
significant pleural effusion. No pneumothorax.

No acute osseous abnormality.
IMPRESSION: 1. Cardiomegaly with mild diffuse interstitial congestion/edema.
Cardiomegaly has mildly progressed relative to most recent
radiograph from 01/01/2009.
[DATE]. No other active cardiopulmonary disease.
3. Aortic atherosclerosis, also progressed relative to 4161.
